# Patient Record
Sex: Male | Born: 1990 | Race: Asian | Hispanic: No | Marital: Married | Smoking: Never smoker
Health system: Southern US, Community
[De-identification: ages and names within clinical notes are randomized; demographics above are authoritative.]

## PROBLEM LIST (undated history)

## (undated) HISTORY — PX: NO PAST SURGERIES: SHX2092

---

## 2017-11-12 ENCOUNTER — Ambulatory Visit
Admission: RE | Admit: 2017-11-12 | Discharge: 2017-11-12 | Disposition: A | Discharge status: Home / Self Care | Payer: No Typology Code available for payment source | Source: Ambulatory Visit | Attending: Internal Medicine | Admitting: Internal Medicine

## 2017-11-12 ENCOUNTER — Other Ambulatory Visit: Payer: Self-pay | Admitting: Internal Medicine

## 2017-11-12 DIAGNOSIS — A150 Tuberculosis of lung: Secondary | ICD-10-CM

## 2018-04-14 NOTE — Progress Notes (Addendum)
[facility] [address], [place], [place] [address] [phone]  Date:  04/15/2018   Name:  Paul Mccann   DOB:  Mar 25, 1991   MRN:  [id number]  PCP:  Darreld Mclean, MD    Chief Complaint: New Patient (Initial Visit) (chest tightness, comes and goes, no pain)   History of Present Illness:  Paul Mccann is a 27 y.o. very pleasant male patient who presents with the following:  New patient here today to establish care. 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  As far as he knows his TB concern is considered to be resolved  No kids yet but they do have dogs He has generally been in good health- no operations or serious illness in the past that he can recall   He would like to check labs today  He is not aware of any health problems in his family  He does mention chest tightness once or twice a month- for about a year now It may last seconds.  He does not feel like he has chest pain- just a tightness It is not exertional- may occur anytime He has not had this sx in the last several weeks   He will do some floor exercises and running.  He does not chest tightness with running No wheezing He is a non smoker and does not drink alcohol In his free time he likes to play games and watch movies  There are no active problems to display for this patient.   No past medical history on file.    Social History   Tobacco Use  . Smoking status: Never Smoker  . Smokeless tobacco: Never Used  Substance Use Topics  . Alcohol use: Not Currently  . Drug use: Never    Family History  Family  history unknown: Yes    Not on File  Medication list has been reviewed and updated.  No current outpatient medications on file prior to visit.   No current facility-administered medications on file prior to visit.     Review of Systems:  As per HPI- otherwise negative.   Physical Examination: Vitals:   04/15/18 0915  BP: 112/70  Pulse: 78  Resp: 16  SpO2: 98%   Vitals:   04/15/18 0915  Weight: 176 lb (79.8 kg)  Height: 5' 8" (1.727 m)   Body mass index is 26.76 kg/m. Ideal Body Weight: Weight in (lb) to have BMI = 25: 164.1  GEN: WDWN, NAD, Non-toxic, A & O x 3, looks well  HEENT: Atraumatic, Normocephalic. Neck supple. No masses, No LAD.  Bilateral TM wnl, oropharynx normal.  PEERL,EOMI.   Ears and Nose: No external deformity. CV: RRR, No M/G/R. No JVD. No thrill. No extra heart sounds. PULM: CTA B, no wheezes, crackles, rhonchi. No retractions. No resp. distress. No accessory muscle use. ABD: S, NT, ND. No rebound. No HSM. EXTR: No c/c/e NEURO Normal gait.  PSYCH: Normally interactive. Conversant. Not depressed or anxious appearing.  Calm demeanor.   EKG: NSR, no ST elevation  or depression noted  He does have early repol pattern Assessment and Plan: Physical exam  Screening for deficiency anemia - Plan: CBC  Screening for hyperlipidemia - Plan: Lipid panel  Screening for diabetes mellitus - Plan: Comprehensive metabolic panel, Hemoglobin A1c  Chest tightness - Plan: EKG 12-Lead  Exposure to measles virus - Plan: Measles/Mumps/Rubella Immunity  CPE today, new patient He brought an old immunization record from when [place] the [place] - Tdap is utd, he only had one MMR however.  We will do an MMR titer for him along with other labs today Will plan further follow- up pending labs. Discussed chest tightness- his description and lack of risk factors make me feel less concerned. He will monitor and let me know if this is changing or getting worse at all    Signed Jessica Copland, MD   Received his labs 8/23  Results for orders placed or performed in visit on 04/15/18  CBC  Result Value Ref Range   WBC 7.1 4.0 - 10.5 K/uL   RBC 5.09 4.22 - 5.81 Mil/uL   Platelets 310.0 150.0 - 400.0 K/uL   Hemoglobin 14.9 13.0 - 17.0 g/dL   HCT 44.5 39.0 - 52.0 %   MCV 87.4 78.0 - 100.0 fl   MCHC 33.5 30.0 - 36.0 g/dL   RDW 12.9 11.5 - 15.5 %  Comprehensive metabolic panel  Result Value Ref Range   Sodium 137 135 - 145 mEq/L   Potassium 4.0 3.5 - 5.1 mEq/L   Chloride 102 96 - 112 mEq/L   CO2 27 19 - 32 mEq/L   Glucose, Bld 99 70 - 99 mg/dL   BUN 16 6 - 23 mg/dL   Creatinine, Ser 0.89 0.40 - 1.50 mg/dL   Total Bilirubin 0.7 0.2 - 1.2 mg/dL   Alkaline Phosphatase 75 39 - 117 U/L   AST 20 0 - 37 U/L   ALT 36 0 - 53 U/L   Total Protein 7.3 6.0 - 8.3 g/dL   Albumin 4.7 3.5 - 5.2 g/dL   Calcium 10.2 8.4 - 10.5 mg/dL   GFR 108.85 >60.00 mL/min  Hemoglobin A1c  Result Value Ref Range   Hgb A1c MFr Bld 5.8 4.6 - 6.5 %  Lipid panel  Result Value Ref Range   Cholesterol 219 (H) 0 - 200 mg/dL   Triglycerides 182.0 (H) 0.0 - 149.0 mg/dL   HDL 42.00 >39.00 mg/dL   VLDL 36.4 0.0 - 40.0 mg/dL   LDL Cholesterol 140 (H) 0 - 99 mg/dL   Total CHOL/HDL Ratio 5    NonHDL 176.62   Measles/Mumps/Rubella Immunity  Result Value Ref Range   Rubeola IgG >300.00 AU/mL   Mumps IgG 75.70 AU/mL   Rubella 10.70 index     

## 2018-04-15 ENCOUNTER — Encounter: Payer: Self-pay | Admitting: Family Medicine

## 2018-04-15 ENCOUNTER — Ambulatory Visit: Payer: 59 | Admitting: Family Medicine

## 2018-04-15 VITALS — BP 112/70 | HR 78 | Resp 16 | Ht 68.0 in | Wt 176.0 lb

## 2018-04-15 DIAGNOSIS — Z130 Encounter for screening for diseases of the blood and blood-forming organs and certain disorders involving the immune mechanism: Secondary | ICD-10-CM

## 2018-04-15 DIAGNOSIS — R0789 Other chest pain: Secondary | ICD-10-CM

## 2018-04-15 DIAGNOSIS — Z13220 Encounter for screening for lipoid disorders: Secondary | ICD-10-CM | POA: Diagnosis not present

## 2018-04-15 DIAGNOSIS — Z20828 Contact with and (suspected) exposure to other viral communicable diseases: Secondary | ICD-10-CM

## 2018-04-15 DIAGNOSIS — Z0000 Encounter for general adult medical examination without abnormal findings: Secondary | ICD-10-CM | POA: Diagnosis not present

## 2018-04-15 DIAGNOSIS — Z131 Encounter for screening for diabetes mellitus: Secondary | ICD-10-CM | POA: Diagnosis not present

## 2018-04-15 LAB — LIPID PANEL
Cholesterol: 219 mg/dL — ABNORMAL HIGH (ref 0–200)
HDL: 42 mg/dL (ref >39.00)
LDL Cholesterol: 140 mg/dL — ABNORMAL HIGH (ref 0–99)
NONHDL: 176.62
Total CHOL/HDL Ratio: 5
Triglycerides: 182 mg/dL — ABNORMAL HIGH (ref 0.0–149.0)
VLDL: 36.4 mg/dL (ref 0.0–40.0)

## 2018-04-15 LAB — COMPREHENSIVE METABOLIC PANEL
ALBUMIN: 4.7 g/dL (ref 3.5–5.2)
ALT: 36 U/L (ref 0–53)
AST: 20 U/L (ref 0–37)
Alkaline Phosphatase: 75 U/L (ref 39–117)
BILIRUBIN TOTAL: 0.7 mg/dL (ref 0.2–1.2)
BUN: 16 mg/dL (ref 6–23)
CALCIUM: 10.2 mg/dL (ref 8.4–10.5)
CHLORIDE: 102 meq/L (ref 96–112)
CO2: 27 mEq/L (ref 19–32)
CREATININE: 0.89 mg/dL (ref 0.40–1.50)
GFR: 108.85 mL/min (ref >60.00)
Glucose, Bld: 99 mg/dL (ref 70–99)
Potassium: 4 mEq/L (ref 3.5–5.1)
SODIUM: 137 meq/L (ref 135–145)
Total Protein: 7.3 g/dL (ref 6.0–8.3)

## 2018-04-15 LAB — HEMOGLOBIN A1C: HEMOGLOBIN A1C: 5.8 % (ref 4.6–6.5)

## 2018-04-15 LAB — CBC
HCT: 44.5 % (ref 39.0–52.0)
Hemoglobin: 14.9 g/dL (ref 13.0–17.0)
MCHC: 33.5 g/dL (ref 30.0–36.0)
MCV: 87.4 fl (ref 78.0–100.0)
PLATELETS: 310 10*3/uL (ref 150.0–400.0)
RBC: 5.09 Mil/uL (ref 4.22–5.81)
RDW: 12.9 % (ref 11.5–15.5)
WBC: 7.1 10*3/uL (ref 4.0–10.5)

## 2018-04-15 NOTE — Patient Instructions (Addendum)
It was nice to meet you today-  I will be in touch with your labs asap Please let me know if your chest tightness changes or gets worse Do continue to exercise on a regular basis and maintain your weight You will need a tetanus shot next year  I only see evidence of one MMR vaccine- we will screen for immunity for you today with your labs as you are at risk of exposure    Health Maintenance, Male A healthy lifestyle and preventive care is important for your health and wellness. Ask your health care provider about what schedule of regular examinations is right for you. What should I know about weight and diet? Eat a Healthy Diet  Eat plenty of vegetables, fruits, whole grains, low-fat dairy products, and lean protein.  Do not eat a lot of foods high in solid fats, added sugars, or salt.  Maintain a Healthy Weight Regular exercise can help you achieve or maintain a healthy weight. You should:  Do at least 150 minutes of exercise each week. The exercise should increase your heart rate and make you sweat (moderate-intensity exercise).  Do strength-training exercises at least twice a week.  Watch Your Levels of Cholesterol and Blood Lipids  Have your blood tested for lipids and cholesterol every 5 years starting at 27 years of age. If you are at high risk for heart disease, you should start having your blood tested when you are 27 years old. You may need to have your cholesterol levels checked more often if: ? Your lipid or cholesterol levels are high. ? You are older than 27 years of age. ? You are at high risk for heart disease.  What should I know about cancer screening? Many types of cancers can be detected early and may often be prevented. Lung Cancer  You should be screened every year for lung cancer if: ? You are a current smoker who has smoked for at least 30 years. ? You are a former smoker who has quit within the past 15 years.  Talk to your health care provider about your  screening options, when you should start screening, and how often you should be screened.  Colorectal Cancer  Routine colorectal cancer screening usually begins at 27 years of age and should be repeated every 5-10 years until you are 27 years old. You may need to be screened more often if early forms of precancerous polyps or small growths are found. Your health care provider may recommend screening at an earlier age if you have risk factors for colon cancer.  Your health care provider may recommend using home test kits to check for hidden blood in the stool.  A small camera at the end of a tube can be used to examine your colon (sigmoidoscopy or colonoscopy). This checks for the earliest forms of colorectal cancer.  Prostate and Testicular Cancer  Depending on your age and overall health, your health care provider may do certain tests to screen for prostate and testicular cancer.  Talk to your health care provider about any symptoms or concerns you have about testicular or prostate cancer.  Skin Cancer  Check your skin from head to toe regularly.  Tell your health care provider about any new moles or changes in moles, especially if: ? There is a change in a mole's size, shape, or color. ? You have a mole that is larger than a pencil eraser.  Always use sunscreen. Apply sunscreen liberally and repeat throughout the day.  Protect yourself by wearing long sleeves, pants, a wide-brimmed hat, and sunglasses when outside.  What should I know about heart disease, diabetes, and high blood pressure?  If you are 42-37 years of age, have your blood pressure checked every 3-5 years. If you are 54 years of age or older, have your blood pressure checked every year. You should have your blood pressure measured twice-once when you are at a hospital or clinic, and once when you are not at a hospital or clinic. Record the average of the two measurements. To check your blood pressure when you are not at  a hospital or clinic, you can use: ? An automated blood pressure machine at a pharmacy. ? A home blood pressure monitor.  Talk to your health care provider about your target blood pressure.  If you are between 32-32 years old, ask your health care provider if you should take aspirin to prevent heart disease.  Have regular diabetes screenings by checking your fasting blood sugar level. ? If you are at a normal weight and have a low risk for diabetes, have this test once every three years after the age of 45. ? If you are overweight and have a high risk for diabetes, consider being tested at a younger age or more often.  A one-time screening for abdominal aortic aneurysm (AAA) by ultrasound is recommended for men aged 64-75 years who are current or former smokers. What should I know about preventing infection? Hepatitis B If you have a higher risk for hepatitis B, you should be screened for this virus. Talk with your health care provider to find out if you are at risk for hepatitis B infection. Hepatitis C Blood testing is recommended for:  Everyone born from [birth year] through 1965.  Anyone with known risk factors for hepatitis C.  Sexually Transmitted Diseases (STDs)  You should be screened each year for STDs including gonorrhea and chlamydia if: ? You are sexually active and are younger than 27 years of age. ? You are older than 27 years of age and your health care provider tells you that you are at risk for this type of infection. ? Your sexual activity has changed since you were last screened and you are at an increased risk for chlamydia or gonorrhea. Ask your health care provider if you are at risk.  Talk with your health care provider about whether you are at high risk of being infected with HIV. Your health care provider may recommend a prescription medicine to help prevent HIV infection.  What else can I do?  Schedule regular health, dental, and eye exams.  Stay current with  your vaccines (immunizations).  Do not use any tobacco products, such as cigarettes, chewing tobacco, and e-cigarettes. If you need help quitting, ask your health care provider.  Limit alcohol intake to no more than 2 drinks per day. One drink equals 12 ounces of beer, 5 ounces of wine, or 1 ounces of hard liquor.  Do not use street drugs.  Do not share needles.  Ask your health care provider for help if you need support or information about quitting drugs.  Tell your health care provider if you often feel depressed.  Tell your health care provider if you have ever been abused or do not feel safe at home. This information is not intended to replace advice given to you by your health care provider. Make sure you discuss any questions you have with your health care provider. Document Released: 02/08/2008 Document  Revised: 04/10/2016 Document Reviewed: 05/16/2015 Elsevier Interactive Patient Education  [employer].

## 2018-04-16 LAB — MEASLES/MUMPS/RUBELLA IMMUNITY
Mumps IgG: 75.7 AU/mL
Rubella: 10.7 index
Rubeola IgG: >300 AU/mL

## 2018-11-10 NOTE — Progress Notes (Deleted)
[facility] [address] [place], [place] [address] [phone]  Date:  11/11/2018   Name:  BRISCO BOLEYN   DOB:  07/07/1991   MRN:  [id number]  PCP:  Pearline Cables, MD    Chief Complaint: No chief complaint on file.   History of Present Illness:  MATTHEUS LUGG is a 28 y.o. 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  As far as he knows his TB concern is considered to be resolved  No kids yet but they do have dogs  There are no active problems to display for this patient.   No past medical history on file.  No past surgical history on file.  Social History   Tobacco Use  . Smoking status: Never Smoker  . Smokeless tobacco: Never Used  Substance Use Topics  . Alcohol use: Not Currently  . Drug use: Never    Family History  Family history unknown: Yes    Not on File  Medication list has been reviewed and updated.  No current outpatient medications on file prior to visit.   No current facility-administered medications on file prior to visit.     Review of Systems:  As per HPI- otherwise negative.   Physical Examination: There were no vitals filed for this visit. There were no vitals filed for this visit. There is no height or weight on file to calculate BMI. Ideal Body Weight:    GEN: WDWN, NAD, Non-toxic, A & O x 3 HEENT: Atraumatic, Normocephalic. Neck supple. No masses, No LAD. Ears and Nose: No external deformity. CV: RRR, No M/G/R. No JVD. No thrill. No extra  heart sounds. PULM: CTA B, no wheezes, crackles, rhonchi. No retractions. No resp. distress. No accessory muscle use. ABD: S, NT, ND, +BS. No rebound. No HSM. EXTR: No c/c/e NEURO Normal gait.  PSYCH: Normally interactive. Conversant. Not depressed or anxious appearing.  Calm demeanor.    Assessment and Plan: ***  Signed Abbe Amsterdam, MD

## 2018-11-11 ENCOUNTER — Other Ambulatory Visit: Payer: Self-pay

## 2018-11-11 ENCOUNTER — Ambulatory Visit: Payer: 59 | Admitting: Family Medicine

## 2018-11-11 ENCOUNTER — Encounter: Payer: Self-pay | Admitting: Family Medicine

## 2018-11-11 VITALS — BP 108/84 | HR 84 | Temp 99.8°F | Resp 18 | Ht 68.0 in | Wt 187.9 lb

## 2018-11-11 DIAGNOSIS — R6889 Other general symptoms and signs: Secondary | ICD-10-CM

## 2018-11-11 LAB — POC INFLUENZA A&B (BINAX/QUICKVUE)
Influenza A, POC: NEGATIVE
Influenza B, POC: NEGATIVE

## 2018-11-11 NOTE — Progress Notes (Signed)
[facility] [address] [place], [place] [address] [phone]  Date:  11/11/2018   Name:  Paul Mccann   DOB:  [birth year]   MRN:  [id number]  PCP:  Pearline Cables, MD    Chief Complaint: Cough (Pt states having stuffy nose on Monday and cough started last night. Pt reports chills, no body aches/SOB and didn't check temp. )   History of Present Illness:  Paul Mccann is a 28 y.o. 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   No past medical history on file.  No past surgical history on file.  Social History   Tobacco Use  . Smoking status: Never Smoker  . Smokeless tobacco: Never Used  Substance Use Topics  . Alcohol use: Not Currently  . Drug use: Never    Family History  Family history unknown: Yes    No Known Allergies  Medication list has been reviewed and updated.  No current outpatient medications on file prior to visit.   No current facility-administered medications on file prior to visit.     Review of Systems:  As per HPI- otherwise negative.   Physical Examination: Vitals:   11/11/18 1614 11/11/18 1649  BP: 108/84   Pulse: (!) 104 84  Resp: 18   Temp: 99.8 F (37.7 C)   SpO2: 97%    Vitals:   11/11/18 1614  Weight: 187 lb 14.4 oz (85.2 kg)  Height: 5\' 8"   (1.727 m)   Body mass index is 28.57 kg/m. Ideal Body Weight: Weight in (lb) to have BMI = 25: 164.1  GEN: WDWN, NAD, Non-toxic, A & O x 3, looks well except for nasal congestion HEENT: Atraumatic, Normocephalic. Neck supple. No masses, No LAD.  Bilateral TM wnl, oropharynx normal.  PEERL,EOMI.   Ears and Nose: No external deformity. CV: RRR, No M/G/R. No JVD. No thrill. No extra heart sounds. PULM: CTA B, no wheezes, crackles, rhonchi. No retractions. No resp. distress. No accessory muscle use. ABD: S, NT, ND EXTR: No c/c/e NEURO Normal gait.  PSYCH: Normally interactive. Conversant. Not depressed or anxious appearing.  Calm demeanor.   Results for orders placed or performed in visit on 11/11/18  POC Influenza A&B (Binax test)  Result Value Ref Range   Influenza A, POC Negative Negative   Influenza B, POC Negative Negative     Assessment and Plan: Flu-like symptoms - Plan: POC Influenza A&B (Binax test)  Pt here today with mild flu like sx Rapid flu negative  He does not currently meet guidelines for COVID 19 testing as no travel and no known positive contact.  He also has no proven significant fever.  However I have advised him that as  testing become more widely available I am glad to order test for him if he likes Have asked him to self- quarantine and gave note for his job for him to work from home   He is to alert me or otherwise seek care if getting worse or any other concerns Signed Abbe Amsterdam, MD

## 2018-11-11 NOTE — Patient Instructions (Signed)
You appear to have a viral infection- at this time you are not high risk for COVID 19, and we do not have the capacity to test non high- risk persons.  However if you are getting worse over the next few days please do alert me Also I am hoping that testing will extend to more people in the coming days; as this occurs I am glad to order testing for you  For the time being please self quarantine at home as much as you are able  Coronavirus (COVID-19) Are you at risk?  Are you at risk for the Coronavirus (COVID-19)?  To be considered HIGH RISK for Coronavirus (COVID-19), you have to meet the following criteria:  . Traveled to [place], [place], [place], [place] or [place]; or in the [place] to [place], [place], [place], or [place]; and have fever, cough, and shortness of breath within the last 2 weeks of travel OR . Been in close contact with a person diagnosed with COVID-19 within the last 2 weeks and have fever, cough, and shortness of breath . IF YOU DO NOT MEET THESE CRITERIA, YOU ARE CONSIDERED LOW RISK FOR COVID-19.  What to do if you are HIGH RISK for COVID-19?  Marland Kitchen If you are having a medical emergency, call 911. . Seek medical care right away. Before you go to a doctor's office, urgent care or emergency department, call ahead and tell them about your recent travel, contact with someone diagnosed with COVID-19, and your symptoms. You should receive instructions from your physician's office regarding next steps of care.  . When you arrive at healthcare provider, tell the healthcare staff immediately you have returned from visiting [place], [place], [place], [place] or [place]; or traveled in the [place] to [place], [place], [place], or [place]; in the last two weeks or you have been in close contact with a person diagnosed with COVID-19 in the last 2 weeks.   . Tell the health care staff about your symptoms: fever, cough and shortness of breath. . After you have been  seen by a medical provider, you will be either: o Tested for (COVID-19) and discharged home on quarantine except to seek medical care if symptoms worsen, and asked to  - Stay home and avoid contact with others until you get your results (4-5 days)  - Avoid travel on public transportation if possible (such as bus, train, or airplane) or o Sent to the Emergency Department by EMS for evaluation, COVID-19 testing, and possible admission depending on your condition and test results.  What to do if you are LOW RISK for COVID-19?  Reduce your risk of any infection by using the same precautions used for avoiding the common cold or flu:  Marland Kitchen Wash your hands often with soap and warm water for at least 20 seconds.  If soap and water are not readily available, use an alcohol-based hand sanitizer with at least 60% alcohol.  . If coughing or sneezing, cover your mouth and nose by coughing or sneezing into the elbow areas of your shirt or coat, into a tissue or into your sleeve (not your hands). . Avoid shaking hands with others and consider head nods or verbal greetings only. . Avoid touching your eyes, nose, or mouth with unwashed hands.  . Avoid close contact with people who are sick. . Avoid places or events with large numbers of people in one location, like concerts or sporting events. . Carefully consider travel plans you  have or are making. . If you are planning any travel outside or inside the [place], visit the CDC's Travelers' Health webpage for the latest health notices. . If you have some symptoms but not all symptoms, continue to monitor at home and seek medical attention if your symptoms worsen. . If you are having a medical emergency, call 911.   ADDITIONAL HEALTHCARE OPTIONS FOR PATIENTS  [facility] / e-Visit: [email or web address]         [facility]: [phone]  [facility]: [phone]                   [facility]  [facility]: [phone]

## 2018-11-12 ENCOUNTER — Encounter: Payer: Self-pay | Admitting: Family Medicine

## 2018-11-12 ENCOUNTER — Telehealth: Payer: 59 | Admitting: Family

## 2018-11-12 ENCOUNTER — Other Ambulatory Visit: Payer: Self-pay | Admitting: Family Medicine

## 2018-11-12 DIAGNOSIS — J069 Acute upper respiratory infection, unspecified: Secondary | ICD-10-CM

## 2018-11-12 MED ORDER — BENZONATATE 100 MG PO CAPS: 100.0000 mg | ORAL_CAPSULE | Freq: Three times a day (TID) | ORAL | 0 refills | Status: DC | PRN

## 2018-11-12 MED ORDER — ALBUTEROL SULFATE HFA 108 (90 BASE) MCG/ACT IN AERS: 2.0000 | INHALATION_SPRAY | Freq: Four times a day (QID) | RESPIRATORY_TRACT | 0 refills | Status: DC | PRN

## 2018-11-12 MED ORDER — BENZONATATE 200 MG PO CAPS
200.0000 mg | ORAL_CAPSULE | Freq: Three times a day (TID) | ORAL | 0 refills | Status: DC | PRN
Start: 2018-11-12 — End: 2018-11-12

## 2018-11-12 NOTE — Progress Notes (Signed)
Greater than 5 minutes, yet less than 10 minutes of time have been spent researching, coordinating, and implementing care for this patient today.  Thank you for the details you included in the comment boxes. Those details are very helpful in determining the best course of treatment for you and help [place] to provide the best care.  The good news is that your fever is improving. That, and not having severe shortness of breath steer [place] away from testing for the virus. You are low-risk.  E-Visit for Corona Virus Screening Based on your current symptoms, it seems unlikely that your symptoms are related to the [place] virus.   Coronavirus disease 2019 (COVID-19) is a respiratory illness that can spread from person to person. The virus that causes COVID-19 is a new virus that was first identified in the country of [place] but is now found in multiple other countries and has spread to the [place].  Symptoms associated with the virus are mild to severe fever, cough, and shortness of breath. There is currently no vaccine to protect against COVID-19, and there is no specific antiviral treatment for the virus.  It is vitally important that if you feel that you have an infection such as this virus or any other virus that you stay home and away from places where you may spread it to others.  Currently, not all patients are being tested. If the symptoms are mild and there is not a known exposure, performing the test is not indicated.  You can use medication such as A prescription cough medication called Tessalon Perles 100 mg. You may take 1-2 capsules every 8 hours as needed for cough and A prescription inhaler called Albuterol MDI 90 mcg /actuation 2 puffs every 4 hours as needed for shortness of breath, wheezing, cough  Reduce your risk of any infection by using the same precautions used for avoiding the common cold or flu:  Marland Kitchen Wash your hands often with soap and warm water for at least 20 seconds.  If soap and  water are not readily available, use an alcohol-based hand sanitizer with at least 60% alcohol.  . If coughing or sneezing, cover your mouth and nose by coughing or sneezing into the elbow areas of your shirt or coat, into a tissue or into your sleeve (not your hands). . Avoid shaking hands with others and consider head nods or verbal greetings only. . Avoid touching your eyes, nose, or mouth with unwashed hands.  . Avoid close contact with people who are sick. . Avoid places or events with large numbers of people in one location, like concerts or sporting events. . Carefully consider travel plans you have or are making. . If you are planning any travel outside or inside the [place], visit the CDC's Travelers' Health webpage for the latest health notices. . If you have some symptoms but not all symptoms, continue to monitor at home and seek medical attention if your symptoms worsen. . If you are having a medical emergency, call 911.  HOME CARE . Only take medications as instructed by your medical team. . Drink plenty of fluids and get plenty of rest. . A steam or ultrasonic humidifier can help if you have congestion.   GET HELP RIGHT AWAY IF: . You develop worsening fever. . You become short of breath . You cough up blood. . Your symptoms become more severe MAKE SURE YOU   Understand these instructions.  Will watch your condition.  Will get help right away  if you are not doing well or get worse.  Your e-visit answers were reviewed by a board certified advanced clinical practitioner to complete your personal care plan.  Depending on the condition, your plan could have included both over the counter or prescription medications.  If there is a problem please reply once you have received a response from your provider. Your safety is important to [place].  If you have drug allergies check your prescription carefully.    You can use MyChart to ask questions about today's visit, request a non-urgent  call back, or ask for a work or school excuse for 24 hours related to this e-Visit. If it has been greater than 24 hours you will need to follow up with your provider, or enter a new e-Visit to address those concerns. You will get an e-mail in the next two days asking about your experience.  I hope that your e-visit has been valuable and will speed your recovery. Thank you for using e-visits.

## 2018-11-12 NOTE — Addendum Note (Signed)
Addended by: Abbe Amsterdam C on: 11/12/2018 07:04 PM   Modules accepted: Orders

## 2018-11-22 ENCOUNTER — Encounter: Payer: Self-pay | Admitting: Family Medicine

## 2018-11-22 DIAGNOSIS — R05 Cough: Secondary | ICD-10-CM

## 2018-11-22 DIAGNOSIS — R059 Cough, unspecified: Secondary | ICD-10-CM

## 2018-11-23 ENCOUNTER — Encounter: Payer: Self-pay | Admitting: Family Medicine

## 2018-11-23 DIAGNOSIS — R059 Cough, unspecified: Secondary | ICD-10-CM

## 2018-11-23 DIAGNOSIS — R05 Cough: Secondary | ICD-10-CM

## 2018-11-23 MED ORDER — AZITHROMYCIN 250 MG PO TABS: ORAL_TABLET | ORAL | 0 refills | Status: DC

## 2018-11-23 MED ORDER — HYDROCOD POLST-CPM POLST ER 10-8 MG/5ML PO SUER: 2.5000 mL | Freq: Two times a day (BID) | ORAL | 0 refills | Status: DC | PRN

## 2018-11-23 NOTE — Telephone Encounter (Signed)
Called pt - he notes just a cough, he is otherwise feeling ok The cough is worse at night  This makes it hard to sleep No fever He is not SOB  Overall he is feeling better, just coughing for over 2 weeks now Generally in good health   Will treat with zpack for bronchitis, tussionex for cough (hycodan on back order)

## 2018-12-01 MED ORDER — HYDROCOD POLST-CPM POLST ER 10-8 MG/5ML PO SUER: 2.5000 mL | Freq: Two times a day (BID) | ORAL | 0 refills | Status: DC | PRN

## 2018-12-01 NOTE — Addendum Note (Signed)
Addended by: Abbe Amsterdam C on: 12/01/2018 07:56 AM   Modules accepted: Orders

## 2019-08-30 ENCOUNTER — Ambulatory Visit: Payer: 59 | Attending: Internal Medicine

## 2019-08-30 DIAGNOSIS — Z20822 Contact with and (suspected) exposure to covid-19: Secondary | ICD-10-CM

## 2019-08-31 LAB — NOVEL CORONAVIRUS, NAA: SARS-CoV-2, NAA: NOT DETECTED

## 2019-11-06 ENCOUNTER — Ambulatory Visit: Payer: 59

## 2019-11-19 ENCOUNTER — Ambulatory Visit: Payer: 59

## 2019-11-27 ENCOUNTER — Ambulatory Visit: Payer: 59 | Attending: Internal Medicine

## 2019-11-27 DIAGNOSIS — Z23 Encounter for immunization: Secondary | ICD-10-CM

## 2019-11-27 NOTE — Progress Notes (Signed)
   Covid-19 Vaccination Clinic  Name:  Paul Mccann    MRN: [id number] DOB: 1990-11-06  11/27/2019  Paul Mccann was observed post Covid-19 immunization for 15 minutes without incident. He was provided with Vaccine Information Sheet and instruction to access the V-Safe system.   Paul Mccann was instructed to call 911 with any severe reactions post vaccine: Marland Kitchen Difficulty breathing  . Swelling of face and throat  . A fast heartbeat  . A bad rash all over body  . Dizziness and weakness   Immunizations Administered    Name Date Dose VIS Date Route   Pfizer COVID-19 Vaccine 11/27/2019 10:36 AM 0.3 mL 08/06/2019 Intramuscular   Manufacturer: [employer], [employer]   Lot: [id number]   NDC: [address]-4

## 2019-12-02 ENCOUNTER — Ambulatory Visit: Payer: 59 | Attending: Internal Medicine

## 2019-12-02 ENCOUNTER — Other Ambulatory Visit: Payer: Self-pay

## 2019-12-02 DIAGNOSIS — Z20822 Contact with and (suspected) exposure to covid-19: Secondary | ICD-10-CM

## 2019-12-03 LAB — SARS-COV-2, NAA 2 DAY TAT

## 2019-12-03 LAB — NOVEL CORONAVIRUS, NAA: SARS-CoV-2, NAA: NOT DETECTED

## 2019-12-22 ENCOUNTER — Ambulatory Visit: Payer: 59 | Attending: Internal Medicine

## 2019-12-22 DIAGNOSIS — Z23 Encounter for immunization: Secondary | ICD-10-CM

## 2019-12-22 NOTE — Progress Notes (Signed)
   Covid-19 Vaccination Clinic  Name:  Paul Mccann    MRN: [id number] DOB: 1991-05-16  12/22/2019  Mr. Hedberg was observed post Covid-19 immunization for 15 minutes without incident. He was provided with Vaccine Information Sheet and instruction to access the V-Safe system.   Mr. Roddey was instructed to call 911 with any severe reactions post vaccine: Marland Kitchen Difficulty breathing  . Swelling of face and throat  . A fast heartbeat  . A bad rash all over body  . Dizziness and weakness   Immunizations Administered    Name Date Dose VIS Date Route   Pfizer COVID-19 Vaccine 12/22/2019 10:50 AM 0.3 mL 10/20/2018 Intramuscular   Manufacturer: [employer], [employer]   Lot: [id number]   NDC: [address]-4

## 2019-12-22 NOTE — Progress Notes (Signed)
   Covid-19 Vaccination Clinic  Name:  Paul Mccann    MRN: [id number] DOB: 11/06/1990  12/22/2019  Mr. [name] was observed post Covid-19 immunization for 15 minutes without incident. He was provided with Vaccine Information Sheet and instruction to access the V-Safe system.   Mr. [name] was instructed to call 911 with any severe reactions post vaccine: . Difficulty breathing  . Swelling of face and throat  . A fast heartbeat  . A bad rash all over body  . Dizziness and weakness   Immunizations Administered    Name Date Dose VIS Date Route   Pfizer COVID-19 Vaccine 12/22/2019 10:50 AM 0.3 mL 10/20/2018 Intramuscular   Manufacturer: Pfizer, Inc   Lot: [id number]   NDC: [address]-2     

## 2020-05-08 NOTE — Progress Notes (Deleted)
[facility] [address] [place], [place] [address] [phone]  Date:  05/11/2020   Name:  Paul Mccann   DOB:  [birth year]   MRN:  [id number]  PCP:  Pearline Cables, MD    Chief Complaint: No chief complaint on file.   History of Present Illness:  Paul Mccann is a 29 y.o. very pleasant male patient who presents with the following:  Generally healthy young man here today for physical exam Last seen by myself March 2020 for a sick visit  [place] he has lived in [place] for about 1 decade. He is married  Routine labs are due Tetanus vaccine Flu shot COVID-19 complete  There are no problems to display for this patient.   No past medical history on file.  No past surgical history on file.  Social History   Tobacco Use  . Smoking status: Never Smoker  . Smokeless tobacco: Never Used  Vaping Use  . Vaping Use: Never used  Substance Use Topics  . Alcohol use: Not Currently  . Drug use: Never    Family History  Family history unknown: Yes    No Known Allergies  Medication list has been reviewed and updated.  Current Outpatient Medications on File Prior to Visit  Medication Sig Dispense Refill  . albuterol (PROVENTIL HFA;VENTOLIN HFA) 108 (90 Base) MCG/ACT inhaler Inhale 2 puffs into the lungs every 6 (six) hours as needed for shortness of breath. 1 Inhaler 0  . azithromycin (ZITHROMAX) 250 MG tablet Use as a zpack 6 tablet 0  . benzonatate (TESSALON) 100 MG capsule Take 1 capsule (100 mg total) by mouth 3 (three) times daily as needed for cough. 40 capsule 0  . chlorpheniramine-HYDROcodone (TUSSIONEX PENNKINETIC ER) 10-8 MG/5ML SUER Take 2.5-5 mLs by mouth every 12 (twelve) hours as needed for cough. 50 mL 0   No current facility-administered medications on file prior to visit.    Review of Systems:  As per HPI- otherwise negative.   Physical  Examination: There were no vitals filed for this visit. There were no vitals filed for this visit. There is no height or weight on file to calculate BMI. Ideal Body Weight:    GEN: no acute distress. HEENT: Atraumatic, Normocephalic.  Ears and Nose: No external deformity. CV: RRR, No M/G/R. No JVD. No thrill. No extra heart sounds. PULM: CTA B, no wheezes, crackles, rhonchi. No retractions. No resp. distress. No accessory muscle use. ABD: S, NT, ND, +BS. No rebound. No HSM. EXTR: No c/c/e PSYCH: Normally interactive. Conversant.    Assessment and Plan: *** This visit occurred during the SARS-CoV-2 public health emergency.  Safety protocols were in place, including screening questions prior to the visit, additional usage of staff PPE, and extensive cleaning of exam room while observing appropriate contact time as indicated for disinfecting solutions.    Signed Abbe Amsterdam, MD

## 2020-05-09 ENCOUNTER — Encounter: Payer: Self-pay | Admitting: Family Medicine

## 2020-05-11 ENCOUNTER — Ambulatory Visit (INDEPENDENT_AMBULATORY_CARE_PROVIDER_SITE_OTHER): Payer: 59 | Admitting: Family Medicine

## 2020-05-11 DIAGNOSIS — Z114 Encounter for screening for human immunodeficiency virus [HIV]: Secondary | ICD-10-CM

## 2020-05-11 DIAGNOSIS — Z131 Encounter for screening for diabetes mellitus: Secondary | ICD-10-CM | POA: Diagnosis not present

## 2020-05-11 DIAGNOSIS — Z130 Encounter for screening for diseases of the blood and blood-forming organs and certain disorders involving the immune mechanism: Secondary | ICD-10-CM

## 2020-05-11 DIAGNOSIS — Z13220 Encounter for screening for lipoid disorders: Secondary | ICD-10-CM | POA: Diagnosis not present

## 2020-05-11 DIAGNOSIS — Z0000 Encounter for general adult medical examination without abnormal findings: Secondary | ICD-10-CM

## 2020-05-11 DIAGNOSIS — Z1159 Encounter for screening for other viral diseases: Secondary | ICD-10-CM

## 2020-05-29 ENCOUNTER — Encounter: Payer: 59 | Admitting: Family Medicine

## 2020-05-29 NOTE — Progress Notes (Deleted)
[facility] [address], [address] [place], [place] [address] [phone]  Date:  06/07/2020   Name:  Paul Mccann   DOB:  06-19-1991   MRN:  [id number]  PCP:  Pearline Cables, MD    Chief Complaint: No chief complaint on file.   History of Present Illness:  Paul Mccann is a 29 y.o. very pleasant male patient who presents with the following:  Generally healthy young man here today for physical Last seen by myself for sick visit in March 2020 He works in [employer] Married  Flu vaccine Tetanus vaccine COVID-19 series completed Most recent labs 2019  There are no problems to display for this patient.   No past medical history on file.  No past surgical history on file.  Social History   Tobacco Use  . Smoking status: Never Smoker  . Smokeless tobacco: Never Used  Vaping Use  . Vaping Use: Never used  Substance Use Topics  . Alcohol use: Not Currently  . Drug use: Never    Family History  Family history unknown: Yes    No Known Allergies  Medication list has been reviewed and updated.  Current Outpatient Medications on File Prior to Visit  Medication Sig Dispense Refill  . albuterol (PROVENTIL HFA;VENTOLIN HFA) 108 (90 Base) MCG/ACT inhaler Inhale 2 puffs into the lungs every 6 (six) hours as needed for shortness of breath. 1 Inhaler 0  . azithromycin (ZITHROMAX) 250 MG tablet Use as a zpack 6 tablet 0  . benzonatate (TESSALON) 100 MG capsule Take 1 capsule (100 mg total) by mouth 3 (three) times daily as needed for cough. 40 capsule 0  . chlorpheniramine-HYDROcodone (TUSSIONEX PENNKINETIC ER) 10-8 MG/5ML SUER Take 2.5-5 mLs by mouth every 12 (twelve) hours as needed for cough. 50 mL 0   No current facility-administered medications on file prior to visit.    Review of Systems:  As per HPI- otherwise negative.   Physical Examination: There were no vitals filed for this  visit. There were no vitals filed for this visit. There is no height or weight on file to calculate BMI. Ideal Body Weight:    GEN: no acute distress. HEENT: Atraumatic, Normocephalic.  Ears and Nose: No external deformity. CV: RRR, No M/G/R. No JVD. No thrill. No extra heart sounds. PULM: CTA B, no wheezes, crackles, rhonchi. No retractions. No resp. distress. No accessory muscle use. ABD: S, NT, ND, +BS. No rebound. No HSM. EXTR: No c/c/e PSYCH: Normally interactive. Conversant.    Assessment and Plan: *** This visit occurred during the SARS-CoV-2 public health emergency.  Safety protocols were in place, including screening questions prior to the visit, additional usage of staff PPE, and extensive cleaning of exam room while observing appropriate contact time as indicated for disinfecting solutions.    Signed Abbe Amsterdam, MD

## 2020-06-07 ENCOUNTER — Ambulatory Visit (INDEPENDENT_AMBULATORY_CARE_PROVIDER_SITE_OTHER): Payer: 59 | Admitting: Family Medicine

## 2020-06-07 DIAGNOSIS — Z5329 Procedure and treatment not carried out because of patient's decision for other reasons: Secondary | ICD-10-CM | POA: Diagnosis not present

## 2020-11-03 ENCOUNTER — Encounter: Payer: Self-pay | Admitting: Family Medicine

## 2020-11-12 NOTE — Progress Notes (Addendum)
[facility] [address] [place], [place] [address] [phone]  Date:  11/16/2020   Name:  Paul Mccann   DOB:  [birth year]   MRN:  [id number]  PCP:  Pearline Cables, MD    Chief Complaint: Cough (10/16/2020 covid post)   History of Present Illness:  Paul Mccann is a 30 y.o. very pleasant male patient who presents with the following:  Pt here today to discuss a cough He contacted me 3/11 with the following mychart: Hope you are doing well. Me and my wife had covid 3 weeks ago, we got through it but my cough stays up until now and Robitusin isn't helping me at all. Kind of like the same as last time when I consulted about my cough 2 or 3 years ago. When I get a cough it last for too long.  Cough is dry and hurting my chest every time I cough.   Since we had not seen him in a couple of years I asked him to come in for eval The cough has mostly resolved at this point His energy level is coming back to normal   Flu vaccine- done in the fall  covid booster- he had his booster in February of this year  Tetanus - booster today   His wife is expecting a baby boy in August!   Wt Readings from Last 3 Encounters:  11/16/20 205 lb (93 kg)  11/11/18 187 lb 14.4 oz (85.2 kg)  04/15/18 176 lb (79.8 kg)    There are no problems to display for this patient.   History reviewed. No pertinent past medical history.  History reviewed. No pertinent surgical history.  Social History   Tobacco Use  . Smoking status: Never Smoker  . Smokeless tobacco: Never Used  Vaping Use  . Vaping Use: Never used  Substance Use Topics  . Alcohol use: Not Currently  . Drug use: Never    Family History  Family history unknown: Yes    No Known Allergies  Medication list has been reviewed and updated.  No current outpatient medications on file prior to visit.   No current facility-administered medications on file prior to  visit.    Review of Systems:  As per HPI- otherwise negative.   Physical Examination: Vitals:   11/16/20 1534  BP: 122/80  Pulse: 100  Resp: 17  Temp: 98.4 F (36.9 C)  SpO2: 95%   Vitals:   11/16/20 1534  Weight: 205 lb (93 kg)  Height: 5\' 8"  (1.727 m)   Body mass index is 31.17 kg/m. Ideal Body Weight: Weight in (lb) to have BMI = 25: 164.1  GEN: no acute distress.  Overweight, looks well  HEENT: Atraumatic, Normocephalic.  Ears and Nose: No external deformity. CV: RRR, No M/G/R. No JVD. No thrill. No extra heart sounds. PULM: CTA B, no wheezes, crackles, rhonchi. No retractions. No resp. distress. No accessory muscle use. ABD: S, NT, ND, +BS. No rebound. No HSM. EXTR: No c/c/e PSYCH: Normally interactive. Conversant.    Assessment and Plan: Physical exam  Weight gain - Plan: TSH  Screening for deficiency anemia - Plan: CBC  Screening for diabetes mellitus - Plan: Comprehensive metabolic panel, Hemoglobin A1c  Screening for hyperlipidemia - Plan: Lipid panel  Immunization due - Plan: Td vaccine greater than or equal to 7yo preservative free IM  Encounter for hepatitis C screening test for low risk patient -  Plan: CANCELED: Hepatitis C antibody  CPE today- encouraged pt to work on diet and exercise. Moderate weight loss Updated tetanus Will plan further follow- up pending labs. He has recovered from his covid 19 illness   This visit occurred during the SARS-CoV-2 public health emergency.  Safety protocols were in place, including screening questions prior to the visit, additional usage of staff PPE, and extensive cleaning of exam room while observing appropriate contact time as indicated for disinfecting solutions.     Signed Abbe Amsterdam, MD  Received labs as below 3/25- message to pt Results for orders placed or performed in visit on 11/16/20  CBC  Result Value Ref Range   WBC 8.6 4.0 - 10.5 K/uL   RBC 4.82 4.22 - 5.81 Mil/uL   Platelets  320.0 150.0 - 400.0 K/uL   Hemoglobin 14.4 13.0 - 17.0 g/dL   HCT 40.8 14.4 - 81.8 %   MCV 86.5 78.0 - 100.0 fl   MCHC 34.5 30.0 - 36.0 g/dL   RDW 56.3 14.9 - 70.2 %  Comprehensive metabolic panel  Result Value Ref Range   Sodium 139 135 - 145 mEq/L   Potassium 3.9 3.5 - 5.1 mEq/L   Chloride 100 96 - 112 mEq/L   CO2 28 19 - 32 mEq/L   Glucose, Bld 118 (H) 70 - 99 mg/dL   BUN 14 6 - 23 mg/dL   Creatinine, Ser 6.37 0.40 - 1.50 mg/dL   Total Bilirubin 0.6 0.2 - 1.2 mg/dL   Alkaline Phosphatase 69 39 - 117 U/L   AST 32 0 - 37 U/L   ALT 70 (H) 0 - 53 U/L   Total Protein 7.7 6.0 - 8.3 g/dL   Albumin 5.2 3.5 - 5.2 g/dL   GFR 85.88 >50.27 mL/min   Calcium 10.3 8.4 - 10.5 mg/dL  Hemoglobin X4J  Result Value Ref Range   Hgb A1c MFr Bld 6.0 4.6 - 6.5 %  Lipid panel  Result Value Ref Range   Cholesterol 277 (H) 0 - 200 mg/dL   Triglycerides 287.8 (H) 0.0 - 149.0 mg/dL   HDL 67.67 (L) >20.94 mg/dL   VLDL 70.9 (H) 0.0 - 62.8 mg/dL   Total CHOL/HDL Ratio 7    NonHDL 239.60   TSH  Result Value Ref Range   TSH 1.26 0.35 - 4.50 uIU/mL  LDL cholesterol, direct  Result Value Ref Range   Direct LDL 186.0 mg/dL

## 2020-11-12 NOTE — Patient Instructions (Addendum)
Good to see you again today! [facility] your [facility]!   We boosted your tetanus today I will be in touch with your labs asap Do work on losing the weight you have gained and try to establish a regular exercise program   Health Maintenance, Male Adopting a healthy lifestyle and getting preventive care are important in promoting health and wellness. Ask your health care provider about:  The right schedule for you to have regular tests and exams.  Things you can do on your own to prevent diseases and keep yourself healthy. What should I know about diet, weight, and exercise? Eat a healthy diet  Eat a diet that includes plenty of vegetables, fruits, low-fat dairy products, and lean protein.  Do not eat a lot of foods that are high in solid fats, added sugars, or sodium.   Maintain a healthy weight Body mass index (BMI) is a measurement that can be used to identify possible weight problems. It estimates body fat based on height and weight. Your health care provider can help determine your BMI and help you achieve or maintain a healthy weight. Get regular exercise Get regular exercise. This is one of the most important things you can do for your health. Most adults should:  Exercise for at least 150 minutes each week. The exercise should increase your heart rate and make you sweat (moderate-intensity exercise).  Do strengthening exercises at least twice a week. This is in addition to the moderate-intensity exercise.  Spend less time sitting. Even light physical activity can be beneficial. Watch cholesterol and blood lipids Have your blood tested for lipids and cholesterol at 30 years of age, then have this test every 5 years. You may need to have your cholesterol levels checked more often if:  Your lipid or cholesterol levels are high.  You are older than 30 years of age.  You are at high risk for heart disease. What should I know about cancer screening? Many types of  cancers can be detected early and may often be prevented. Depending on your health history and family history, you may need to have cancer screening at various ages. This may include screening for:  Colorectal cancer.  Prostate cancer.  Skin cancer.  Lung cancer. What should I know about heart disease, diabetes, and high blood pressure? Blood pressure and heart disease  High blood pressure causes heart disease and increases the risk of stroke. This is more likely to develop in people who have high blood pressure readings, are of African descent, or are overweight.  Talk with your health care provider about your target blood pressure readings.  Have your blood pressure checked: ? Every 3-5 years if you are 69-50 years of age. ? Every year if you are 29 years old or older.  If you are between the ages of 93 and 55 and are a current or former smoker, ask your health care provider if you should have a one-time screening for abdominal aortic aneurysm (AAA). Diabetes Have regular diabetes screenings. This checks your fasting blood sugar level. Have the screening done:  Once every three years after age 10 if you are at a normal weight and have a low risk for diabetes.  More often and at a younger age if you are overweight or have a high risk for diabetes. What should I know about preventing infection? Hepatitis B If you have a higher risk for hepatitis B, you should be screened for this virus. Talk with your health care  provider to find out if you are at risk for hepatitis B infection. Hepatitis C Blood testing is recommended for:  Everyone born from [birth year] through 1965.  Anyone with known risk factors for hepatitis C. Sexually transmitted infections (STIs)  You should be screened each year for STIs, including gonorrhea and chlamydia, if: ? You are sexually active and are younger than 30 years of age. ? You are older than 30 years of age and your health care provider tells you that you  are at risk for this type of infection. ? Your sexual activity has changed since you were last screened, and you are at increased risk for chlamydia or gonorrhea. Ask your health care provider if you are at risk.  Ask your health care provider about whether you are at high risk for HIV. Your health care provider may recommend a prescription medicine to help prevent HIV infection. If you choose to take medicine to prevent HIV, you should first get tested for HIV. You should then be tested every 3 months for as long as you are taking the medicine. Follow these instructions at home: Lifestyle  Do not use any products that contain nicotine or tobacco, such as cigarettes, e-cigarettes, and chewing tobacco. If you need help quitting, ask your health care provider.  Do not use street drugs.  Do not share needles.  Ask your health care provider for help if you need support or information about quitting drugs. Alcohol use  Do not drink alcohol if your health care provider tells you not to drink.  If you drink alcohol: ? Limit how much you have to 0-2 drinks a day. ? Be aware of how much alcohol is in your drink. In the [place] one drink equals one 12 oz bottle of beer (355 mL), one 5 oz glass of wine (148 mL), or one 1 oz glass of hard liquor (44 mL). General instructions  Schedule regular health, dental, and eye exams.  Stay current with your vaccines.  Tell your health care provider if: ? You often feel depressed. ? You have ever been abused or do not feel safe at home. Summary  Adopting a healthy lifestyle and getting preventive care are important in promoting health and wellness.  Follow your health care provider's instructions about healthy diet, exercising, and getting tested or screened for diseases.  Follow your health care provider's instructions on monitoring your cholesterol and blood pressure. This information is not intended to replace advice given to you by your health care  provider. Make sure you discuss any questions you have with your health care provider. Document Revised: 08/05/2018 Document Reviewed: 08/05/2018 Elsevier Patient Education  2021 [employer].

## 2020-11-15 ENCOUNTER — Ambulatory Visit: Payer: Self-pay | Admitting: Family Medicine

## 2020-11-16 ENCOUNTER — Encounter: Payer: Self-pay | Admitting: Family Medicine

## 2020-11-16 ENCOUNTER — Other Ambulatory Visit: Payer: Self-pay

## 2020-11-16 ENCOUNTER — Ambulatory Visit: Payer: 59 | Admitting: Family Medicine

## 2020-11-16 VITALS — BP 122/80 | HR 100 | Temp 98.4°F | Resp 17 | Ht 68.0 in | Wt 205.0 lb

## 2020-11-16 DIAGNOSIS — Z13220 Encounter for screening for lipoid disorders: Secondary | ICD-10-CM | POA: Diagnosis not present

## 2020-11-16 DIAGNOSIS — Z1159 Encounter for screening for other viral diseases: Secondary | ICD-10-CM

## 2020-11-16 DIAGNOSIS — Z130 Encounter for screening for diseases of the blood and blood-forming organs and certain disorders involving the immune mechanism: Secondary | ICD-10-CM | POA: Diagnosis not present

## 2020-11-16 DIAGNOSIS — Z0000 Encounter for general adult medical examination without abnormal findings: Secondary | ICD-10-CM

## 2020-11-16 DIAGNOSIS — Z23 Encounter for immunization: Secondary | ICD-10-CM | POA: Diagnosis not present

## 2020-11-16 DIAGNOSIS — R635 Abnormal weight gain: Secondary | ICD-10-CM

## 2020-11-16 DIAGNOSIS — Z131 Encounter for screening for diabetes mellitus: Secondary | ICD-10-CM

## 2020-11-17 ENCOUNTER — Encounter: Payer: Self-pay | Admitting: Family Medicine

## 2020-11-17 LAB — COMPREHENSIVE METABOLIC PANEL
ALT: 70 U/L — ABNORMAL HIGH (ref 0–53)
AST: 32 U/L (ref 0–37)
Albumin: 5.2 g/dL (ref 3.5–5.2)
Alkaline Phosphatase: 69 U/L (ref 39–117)
BUN: 14 mg/dL (ref 6–23)
CO2: 28 mEq/L (ref 19–32)
Calcium: 10.3 mg/dL (ref 8.4–10.5)
Chloride: 100 mEq/L (ref 96–112)
Creatinine, Ser: 1.05 mg/dL (ref 0.40–1.50)
GFR: 95.76 mL/min (ref >60.00)
Glucose, Bld: 118 mg/dL — ABNORMAL HIGH (ref 70–99)
Potassium: 3.9 mEq/L (ref 3.5–5.1)
Sodium: 139 mEq/L (ref 135–145)
Total Bilirubin: 0.6 mg/dL (ref 0.2–1.2)
Total Protein: 7.7 g/dL (ref 6.0–8.3)

## 2020-11-17 LAB — CBC
HCT: 41.7 % (ref 39.0–52.0)
Hemoglobin: 14.4 g/dL (ref 13.0–17.0)
MCHC: 34.5 g/dL (ref 30.0–36.0)
MCV: 86.5 fl (ref 78.0–100.0)
Platelets: 320 10*3/uL (ref 150.0–400.0)
RBC: 4.82 Mil/uL (ref 4.22–5.81)
RDW: 12.8 % (ref 11.5–15.5)
WBC: 8.6 10*3/uL (ref 4.0–10.5)

## 2020-11-17 LAB — LIPID PANEL
Cholesterol: 277 mg/dL — ABNORMAL HIGH (ref 0–200)
HDL: 37.4 mg/dL — ABNORMAL LOW (ref >39.00)
NonHDL: 239.6
Total CHOL/HDL Ratio: 7
Triglycerides: 368 mg/dL — ABNORMAL HIGH (ref 0.0–149.0)
VLDL: 73.6 mg/dL — ABNORMAL HIGH (ref 0.0–40.0)

## 2020-11-17 LAB — HEMOGLOBIN A1C: Hgb A1c MFr Bld: 6 % (ref 4.6–6.5)

## 2020-11-17 LAB — TSH: TSH: 1.26 u[IU]/mL (ref 0.35–4.50)

## 2020-11-17 LAB — LDL CHOLESTEROL, DIRECT: Direct LDL: 186 mg/dL

## 2021-02-19 ENCOUNTER — Encounter: Payer: Self-pay | Admitting: Family Medicine

## 2021-02-23 ENCOUNTER — Encounter: Payer: Self-pay | Admitting: Family Medicine

## 2021-02-28 NOTE — Progress Notes (Deleted)
[facility] at [facility] [address], [address] [place], [place] [address] [phone]  Date:  03/07/2021   Name:  Paul Mccann   DOB:  [birth year]   MRN:  [id number]  PCP:  Pearline Cables, MD    Chief Complaint: No chief complaint on file.   History of Present Illness:  Paul Mccann is a 30 y.o. very pleasant male patient who presents with the following:  Pt seen today to discuss "recnet problems"- he sent [place] the following message:  Hey Dr Dallas Schimke hope you are doing well. Had a problem that started just this morning when I woke up, Felt a burning/sting sensation on the tip of my private part when I pee/while I pee.  This is the first time I've experienced this. Had to stop and slowly pee to ease the pain.  Generally in good health except for overweight, last visit with myself was in March  His wife is due in August   There are no problems to display for this patient.   No past medical history on file.  No past surgical history on file.  Social History   Tobacco Use   Smoking status: Never   Smokeless tobacco: Never  Vaping Use   Vaping Use: Never used  Substance Use Topics   Alcohol use: Not Currently   Drug use: Never    Family History  Family history unknown: Yes    No Known Allergies  Medication list has been reviewed and updated.  No current outpatient medications on file prior to visit.   No current facility-administered medications on file prior to visit.    Review of Systems:  ***  Physical Examination: There were no vitals filed for this visit. There were no vitals filed for this visit. There is no height or weight on file to calculate BMI. Ideal Body Weight:    ***  Assessment and Plan: ***  Signed Abbe Amsterdam, MD

## 2021-03-06 NOTE — Progress Notes (Signed)
[facility] at [facility] [address], [address] [place], [place] [address] [phone]  Date:  03/09/2021   Name:  Paul Mccann   DOB:  04/14/1991   MRN:  [id number]  PCP:  Pearline Cables, MD    Chief Complaint: Sore Throat (Started on Monday ) and Cough (It hurts chest and back because it is violent )   History of Present Illness:  Paul Mccann is a 30 y.o. very pleasant male patient who presents with the following:  Pt seen today for concern of illness Last seen by myself for a CPE in March- he recently had covid 19 at that time  His wife is expecting a baby boy next month -she is also sick right now His most recent labs showed dyslipidemia and also mild LFT elevation, pre diabetes   I had asked him to return in a few months to check his A1c, lipids and liver function   He first got sick this past Monday- today is Friday He noted sx of a painful ST, cough- it hurts to cough  He did a rapid test for covid today symptoms began and it was negative No fever noted No chills or aches No vomiting or diarrhea  He notes that on 7/1 he had an episode of painful urination- he felt like he could not pass his urine  He then felt better and his sx seemed to resolve No blood in his urine No history of kidney stones He is in a monagamous relationship and does not suspect any STI risk  Wt Readings from Last 3 Encounters:  03/09/21 201 lb (91.2 kg)  11/16/20 205 lb (93 kg)  11/11/18 187 lb 14.4 oz (85.2 kg)    There are no problems to display for this patient.   No past medical history on file.  No past surgical history on file.  Social History   Tobacco Use   Smoking status: Never   Smokeless tobacco: Never  Vaping Use   Vaping Use: Never used  Substance Use Topics   Alcohol use: Not Currently   Drug use: Never    Family History  Family history unknown: Yes    No Known Allergies  Medication list has been reviewed  and updated.  Current Outpatient Medications on File Prior to Visit  Medication Sig Dispense Refill   guaiFENesin (MUCINEX) 600 MG 12 hr tablet Take by mouth 2 (two) times daily.     No current facility-administered medications on file prior to visit.    Review of Systems:  As per HPI- otherwise negative.   Physical Examination: Vitals:   03/09/21 0828  BP: 122/78  Pulse: 95  Temp: 98.2 F (36.8 C)  SpO2: 98%   Vitals:   03/09/21 0828  Weight: 201 lb (91.2 kg)  Height: 5\' 8"  (1.727 m)   Body mass index is 30.56 kg/m. Ideal Body Weight: Weight in (lb) to have BMI = 25: 164.1  GEN: no acute distress.  Appears mildly ill HEENT: Atraumatic, Normocephalic.  Bilateral TM wnl, oropharynx is erythematous but no exudate, PEERL,EOMI.   Ears and Nose: No external deformity. CV: RRR, No M/G/R. No JVD. No thrill. No extra heart sounds. PULM: CTA B, no wheezes, crackles, rhonchi. No retractions. No resp. distress. No accessory muscle use. ABD: S, NT, ND, +BS. No rebound. No HSM. EXTR: No c/c/e PSYCH: Normally interactive. Conversant.  No penile discharge or lesions  Results for orders placed  or performed in visit on 03/09/21  POCT rapid strep A  Result Value Ref Range   Rapid Strep A Screen Positive (A) Negative  POCT urinalysis dipstick  Result Value Ref Range   Color, UA yellow yellow   Clarity, UA clear clear   Glucose, UA negative negative mg/dL   Bilirubin, UA negative negative   Ketones, POC UA negative negative mg/dL   Spec Grav, UA 3.846 6.599 - 1.025   Blood, UA negative negative   pH, UA 6.0 5.0 - 8.0   Protein Ur, POC trace (A) negative mg/dL   Urobilinogen, UA 0.2 0.2 or 1.0 E.U./dL   Nitrite, UA Negative Negative   Leukocytes, UA Negative Negative    Assessment and Plan: Strep throat - Plan: penicillin v potassium (VEETID) 500 MG tablet, HYDROcodone bit-homatropine (HYCODAN) 5-1.5 MG/5ML syrup, Novel Coronavirus, NAA ([facility]), CANCELED: Novel  Coronavirus, NAA ([facility])  Pharyngitis, unspecified etiology - Plan: POCT rapid strep A, HYDROcodone bit-homatropine (HYCODAN) 5-1.5 MG/5ML syrup  Dysuria - Plan: Urine Culture, POCT urinalysis dipstick, Cytology (oral, anal, urethral) ancillary only, C. trachomatis/N. gonorrhoeae RNA  Patient seen today with concern of illness.  Consider COVID-19, but his rapid strep is also positive. We will treat him with penicillin for 10 days Hycodan syrup to use as needed for painful sore throat and cough-caution regarding sedation I did send out a COVID-19 PCR  He had an episode of painful urination about 2 weeks ago, now resolved.  Low risk for STI, will check gonorrhea and chlamydia swab, also urine culture  Will plan further follow- up pending labs.  This visit occurred during the SARS-CoV-2 public health emergency.  Safety protocols were in place, including screening questions prior to the visit, additional usage of staff PPE, and extensive cleaning of exam room while observing appropriate contact time as indicated for disinfecting solutions.   Signed Abbe Amsterdam, MD

## 2021-03-07 ENCOUNTER — Ambulatory Visit: Payer: 59 | Admitting: Family Medicine

## 2021-03-09 ENCOUNTER — Other Ambulatory Visit: Payer: Self-pay

## 2021-03-09 ENCOUNTER — Ambulatory Visit: Payer: 59 | Admitting: Family Medicine

## 2021-03-09 ENCOUNTER — Encounter: Payer: Self-pay | Admitting: Family Medicine

## 2021-03-09 VITALS — BP 122/78 | HR 95 | Temp 98.2°F | Ht 68.0 in | Wt 201.0 lb

## 2021-03-09 DIAGNOSIS — R300 Dysuria: Secondary | ICD-10-CM

## 2021-03-09 DIAGNOSIS — J029 Acute pharyngitis, unspecified: Secondary | ICD-10-CM | POA: Diagnosis not present

## 2021-03-09 DIAGNOSIS — J020 Streptococcal pharyngitis: Secondary | ICD-10-CM

## 2021-03-09 LAB — POCT URINALYSIS DIP (MANUAL ENTRY)
Bilirubin, UA: NEGATIVE
Blood, UA: NEGATIVE
Glucose, UA: NEGATIVE mg/dL
Ketones, POC UA: NEGATIVE mg/dL
Leukocytes, UA: NEGATIVE
Nitrite, UA: NEGATIVE
Spec Grav, UA: 1.015 (ref 1.010–1.025)
Urobilinogen, UA: 0.2 E.U./dL
pH, UA: 6 (ref 5.0–8.0)

## 2021-03-09 LAB — POCT RAPID STREP A (OFFICE): Rapid Strep A Screen: POSITIVE — AB

## 2021-03-09 MED ORDER — HYDROCODONE BIT-HOMATROP MBR 5-1.5 MG/5ML PO SOLN: 5.0000 mL | Freq: Three times a day (TID) | ORAL | 0 refills | Status: AC | PRN

## 2021-03-09 MED ORDER — PENICILLIN V POTASSIUM 500 MG PO TABS: 500.0000 mg | ORAL_TABLET | Freq: Two times a day (BID) | ORAL | 0 refills | Status: DC

## 2021-03-09 NOTE — Patient Instructions (Signed)
It was good to see you today but I am sorry you are ill!  You tested positive for strep throat We will treat this with penicillin- you should feel much improved in 1-2 days Please let me know if you are not getting better- sooner if worse You can also use the cough syrup as needed for cough and for pain- it has a pain medication in it, don't drive after taking Stay home from work today  I will be in touch with your urine results

## 2021-03-10 ENCOUNTER — Encounter: Payer: Self-pay | Admitting: Family Medicine

## 2021-03-10 LAB — NOVEL CORONAVIRUS, NAA: SARS-CoV-2, NAA: NOT DETECTED

## 2021-03-10 LAB — SARS-COV-2, NAA 2 DAY TAT

## 2021-03-11 LAB — URINE CULTURE
MICRO NUMBER:: 12124712
Result:: NO GROWTH
SPECIMEN QUALITY:: ADEQUATE

## 2021-03-12 LAB — C. TRACHOMATIS/N. GONORRHOEAE RNA
C. trachomatis RNA, TMA: NOT DETECTED
N. gonorrhoeae RNA, TMA: NOT DETECTED

## 2021-08-29 ENCOUNTER — Ambulatory Visit: Payer: 59 | Admitting: Family Medicine

## 2021-09-09 NOTE — Patient Instructions (Addendum)
Good to see you today!  I will be in touch with your labs asap Flu shot given today  Try a topical anti-fungal cream such as clotrimazole or miconazole twice a day on your rash Assuming your liver is back to normal I can then rx an oral anti-fungal if you need it I would suggest getting the newest covid booster if not done already

## 2021-09-09 NOTE — Progress Notes (Addendum)
[facility] at [facility] [address] [place], [place] [address] [phone]  Date:  09/12/2021   Name:  Paul Mccann   DOB:  [birth year]   MRN:  [id number]  PCP:  Pearline Cables, MD    Chief Complaint: Follow-up (Concerns/ questions: pt has a rash around the right underarm/)   History of Present Illness:  Paul Mccann is a 31 y.o. very pleasant male patient who presents with the following:  Patient seen today for follow-up Most recent visit with myself was in July when he had strep throat Most recent physical was in March  He is married His wife gave birth last August; this is their first child, a son who is now 65 months old.  He is doing well!   Can do hepatitis C screening with lab work COVID-19 booster-recommended Flu shot- give today   Labs done in March.  At that time we noted significant dyslipidemia, elevated ALT, A1c 6%  He has noted an itchy rash in his underarms for 2-3 weeks- started on the right and spread There is also some rash in the groin  He has tried an anti-itch cream which sounds like a topical steroid.  He is otherwise feeling well.  He does not know what could have triggered this rash There are no problems to display for this patient.   No past medical history on file.  No past surgical history on file.  Social History   Tobacco Use   Smoking status: Never   Smokeless tobacco: Never  Vaping Use   Vaping Use: Never used  Substance Use Topics   Alcohol use: Not Currently   Drug use: Never    Family History  Family history unknown: Yes    No Known Allergies  Medication list has been reviewed and updated.  No current outpatient medications on file prior to visit.   No current facility-administered medications on file prior to visit.    Review of Systems:  As per HPI- otherwise negative.   Physical Examination: Vitals:   09/12/21 1257  BP: 112/72  Pulse: 88  Resp: 18   Temp: 98.1 F (36.7 C)  SpO2: 98%   Vitals:   09/12/21 1257  Weight: 202 lb (91.6 kg)  Height: 5\' 8"  (1.727 m)   Body mass index is 30.71 kg/m. Ideal Body Weight: Weight in (lb) to have BMI = 25: 164.1  GEN: no acute distress.  Mild obesity, looks well HEENT: Atraumatic, Normocephalic.  Ears and Nose: No external deformity. CV: RRR, No M/G/R. No JVD. No thrill. No extra heart sounds. PULM: CTA B, no wheezes, crackles, rhonchi. No retractions. No resp. distress. No accessory muscle use. EXTR: No c/c/e PSYCH: Normally interactive. Conversant.  Patient has a mildly erythematous and scaly rash present in both underarms.  He also states a similar rash is present in the groin folds  Assessment and Plan: Hyperlipidemia, unspecified hyperlipidemia type - Plan: Lipid panel  Transaminitis - Plan: Comprehensive metabolic panel, Hepatitis, Acute  Prediabetes - Plan: Hemoglobin A1c  Tinea corporis  Patient seen today for follow-up.  He was noted to have transaminitis last year, at that time he had been taking a lot of Tylenol to deal with the COVID-19 infection.  We will recheck his liver function and also a hepatitis panel today  Follow-up on lipids and prediabetes  Recommend over-the-counter antifungals for tinea corporis.  Assuming his LFTs are normal we can also  use a round of oral terbinafine if needed  Signed Abbe Amsterdam, MD  Received labs as below, message to patient  Results for orders placed or performed in visit on 09/12/21  Comprehensive metabolic panel  Result Value Ref Range   Sodium 138 135 - 145 mEq/L   Potassium 3.8 3.5 - 5.1 mEq/L   Chloride 100 96 - 112 mEq/L   CO2 30 19 - 32 mEq/L   Glucose, Bld 126 (H) 70 - 99 mg/dL   BUN 15 6 - 23 mg/dL   Creatinine, Ser 1.95 0.40 - 1.50 mg/dL   Total Bilirubin 0.9 0.2 - 1.2 mg/dL   Alkaline Phosphatase 59 39 - 117 U/L   AST 32 0 - 37 U/L   ALT 63 (H) 0 - 53 U/L   Total Protein 7.5 6.0 - 8.3 g/dL   Albumin 4.9  3.5 - 5.2 g/dL   GFR 09.32 >67.12 mL/min   Calcium 9.8 8.4 - 10.5 mg/dL  Lipid panel  Result Value Ref Range   Cholesterol 245 (H) 0 - 200 mg/dL   Triglycerides 458.0 (H) 0.0 - 149.0 mg/dL   HDL 99.83 >38.25 mg/dL   VLDL 05.3 (H) 0.0 - 97.6 mg/dL   Total CHOL/HDL Ratio 6    NonHDL 203.60   Hemoglobin A1c  Result Value Ref Range   Hgb A1c MFr Bld 6.1 4.6 - 6.5 %  LDL cholesterol, direct  Result Value Ref Range   Direct LDL 174.0 mg/dL

## 2021-09-12 ENCOUNTER — Encounter: Payer: Self-pay | Admitting: Family Medicine

## 2021-09-12 ENCOUNTER — Ambulatory Visit: Payer: 59 | Admitting: Family Medicine

## 2021-09-12 VITALS — BP 112/72 | HR 88 | Temp 98.1°F | Resp 18 | Ht 68.0 in | Wt 202.0 lb

## 2021-09-12 DIAGNOSIS — R7303 Prediabetes: Secondary | ICD-10-CM

## 2021-09-12 DIAGNOSIS — R7401 Elevation of levels of liver transaminase levels: Secondary | ICD-10-CM | POA: Diagnosis not present

## 2021-09-12 DIAGNOSIS — Z23 Encounter for immunization: Secondary | ICD-10-CM | POA: Diagnosis not present

## 2021-09-12 DIAGNOSIS — B354 Tinea corporis: Secondary | ICD-10-CM | POA: Diagnosis not present

## 2021-09-12 DIAGNOSIS — E663 Overweight: Secondary | ICD-10-CM

## 2021-09-12 DIAGNOSIS — E785 Hyperlipidemia, unspecified: Secondary | ICD-10-CM | POA: Diagnosis not present

## 2021-09-12 HISTORY — DX: Prediabetes: R73.03

## 2021-09-12 HISTORY — DX: Overweight: E66.3

## 2021-09-12 LAB — COMPREHENSIVE METABOLIC PANEL
ALT: 63 U/L — ABNORMAL HIGH (ref 0–53)
AST: 32 U/L (ref 0–37)
Albumin: 4.9 g/dL (ref 3.5–5.2)
Alkaline Phosphatase: 59 U/L (ref 39–117)
BUN: 15 mg/dL (ref 6–23)
CO2: 30 mEq/L (ref 19–32)
Calcium: 9.8 mg/dL (ref 8.4–10.5)
Chloride: 100 mEq/L (ref 96–112)
Creatinine, Ser: 1.02 mg/dL (ref 0.40–1.50)
GFR: 98.58 mL/min (ref >60.00)
Glucose, Bld: 126 mg/dL — ABNORMAL HIGH (ref 70–99)
Potassium: 3.8 mEq/L (ref 3.5–5.1)
Sodium: 138 mEq/L (ref 135–145)
Total Bilirubin: 0.9 mg/dL (ref 0.2–1.2)
Total Protein: 7.5 g/dL (ref 6.0–8.3)

## 2021-09-12 LAB — LDL CHOLESTEROL, DIRECT: Direct LDL: 174 mg/dL

## 2021-09-12 LAB — LIPID PANEL
Cholesterol: 245 mg/dL — ABNORMAL HIGH (ref 0–200)
HDL: 40.9 mg/dL (ref >39.00)
NonHDL: 203.6
Total CHOL/HDL Ratio: 6
Triglycerides: 267 mg/dL — ABNORMAL HIGH (ref 0.0–149.0)
VLDL: 53.4 mg/dL — ABNORMAL HIGH (ref 0.0–40.0)

## 2021-09-12 LAB — HEMOGLOBIN A1C: Hgb A1c MFr Bld: 6.1 % (ref 4.6–6.5)

## 2021-09-12 NOTE — Addendum Note (Signed)
Addended by: Mertha Finders on: 09/12/2021 01:48 PM   Modules accepted: Orders

## 2021-09-13 LAB — HEPATITIS PANEL, ACUTE
Hep A IgM: NONREACTIVE
Hep B C IgM: NONREACTIVE
Hepatitis B Surface Ag: NONREACTIVE
Hepatitis C Ab: NONREACTIVE
SIGNAL TO CUT-OFF: 0.14 (ref <1.00)

## 2021-09-24 ENCOUNTER — Telehealth (HOSPITAL_BASED_OUTPATIENT_CLINIC_OR_DEPARTMENT_OTHER): Payer: Self-pay

## 2021-09-28 ENCOUNTER — Ambulatory Visit (HOSPITAL_BASED_OUTPATIENT_CLINIC_OR_DEPARTMENT_OTHER)
Admission: RE | Admit: 2021-09-28 | Discharge: 2021-09-28 | Disposition: A | Discharge status: Home / Self Care | Payer: 59 | Source: Ambulatory Visit | Attending: Family Medicine | Admitting: Family Medicine

## 2021-09-28 ENCOUNTER — Other Ambulatory Visit: Payer: Self-pay

## 2021-09-28 DIAGNOSIS — R7401 Elevation of levels of liver transaminase levels: Secondary | ICD-10-CM | POA: Insufficient documentation

## 2021-10-01 ENCOUNTER — Encounter: Payer: Self-pay | Admitting: Family Medicine

## 2021-10-01 DIAGNOSIS — K760 Fatty (change of) liver, not elsewhere classified: Secondary | ICD-10-CM

## 2021-10-01 HISTORY — DX: Fatty (change of) liver, not elsewhere classified: K76.0

## 2021-11-13 ENCOUNTER — Ambulatory Visit: Payer: 59 | Admitting: Family

## 2021-11-13 VITALS — BP 129/86 | HR 87 | Temp 98.5°F | Resp 16 | Wt 201.8 lb

## 2021-11-13 DIAGNOSIS — J069 Acute upper respiratory infection, unspecified: Secondary | ICD-10-CM

## 2021-11-13 HISTORY — DX: Acute upper respiratory infection, unspecified: J06.9

## 2021-11-13 NOTE — Progress Notes (Signed)
? ?Subjective:  ? ?By signing my name below, I, Cassell Clement, attest that this documentation has been prepared under the direction and in the presence of Sandford Craze NP, 11/13/2021 ? ? Patient ID: Paul Mccann, male    DOB: 03-21-1991, 31 y.o.   MRN: [id number] ? ?Chief Complaint  ?Patient presents with  ? Nasal Congestion  ?  Patient complains of congestion   ? Fatigue  ?  Feeling fatigue  ? ? ?HPI ?Patient is in today for an office visit. ? ?Virus symptoms - Patient complains of a sore throat and sinus pain that begun on 11/11/2021. He believes that he developed his symptoms from his partner. His sore throat symptoms went away on 11/12/2021. However, his sinus pain is still persistent. He states that his body feels heavy/weak. He rarely coughs. He took a COVID - 19 test and it came back negative. He denies of any ear pain.  ? ?Health Maintenance Due  ?Topic Date Due  ? COVID-19 Vaccine (3 - Booster for Pfizer series) 02/16/2020  ? ? ?No past medical history on file. ? ?No past surgical history on file. ? ?Family History  ?Family history unknown: Yes  ? ? ?Social History  ? ?Socioeconomic History  ? Marital status: Married  ?  Spouse name: Not on file  ? Number of children: Not on file  ? Years of education: Not on file  ? Highest education level: Not on file  ?Occupational History  ? Not on file  ?Tobacco Use  ? Smoking status: Never  ? Smokeless tobacco: Never  ?Vaping Use  ? Vaping Use: Never used  ?Substance and Sexual Activity  ? Alcohol use: Not Currently  ? Drug use: Never  ? Sexual activity: Not on file  ?Other Topics Concern  ? Not on file  ?Social History Narrative  ? Not on file  ? ?Social Determinants of Health  ? ?Financial Resource Strain: Not on file  ?Food Insecurity: Not on file  ?Transportation Needs: Not on file  ?Physical Activity: Not on file  ?Stress: Not on file  ?Social Connections: Not on file  ?Intimate Partner Violence: Not on file  ? ? ?No outpatient medications prior to visit.   ? ?No facility-administered medications prior to visit.  ? ? ?No Known Allergies ? ?Review of Systems  ?HENT:  Positive for sinus pain. Negative for ear pain.   ?Respiratory:  Positive for cough (Rarely).   ?Neurological:  Positive for weakness.  ? ?   ?Objective:  ?  ?Physical Exam ?Constitutional:   ?   General: He is not in acute distress. ?   Appearance: Normal appearance. He is not ill-appearing.  ?HENT:  ?   Head: Normocephalic and atraumatic.  ?   Right Ear: External ear normal.  ?   Left Ear: External ear normal.  ?   Mouth/Throat:  ?   Pharynx: Posterior oropharyngeal erythema (Mild) present. No oropharyngeal exudate.  ?Eyes:  ?   Extraocular Movements: Extraocular movements intact.  ?   Pupils: Pupils are equal, round, and reactive to light.  ?Cardiovascular:  ?   Rate and Rhythm: Normal rate and regular rhythm.  ?   Heart sounds: Normal heart sounds. No murmur heard. ?  No gallop.  ?Pulmonary:  ?   Effort: Pulmonary effort is normal. No respiratory distress.  ?   Breath sounds: Normal breath sounds. No wheezing or rales.  ?Skin: ?   General: Skin is warm and dry.  ?Neurological:  ?  Mental Status: He is alert and oriented to person, place, and time.  ?Psychiatric:     ?   Mood and Affect: Mood normal.     ?   Behavior: Behavior normal.     ?   Judgment: Judgment normal.  ? ? ?BP 129/86 (BP Location: Right Arm, Patient Position: Sitting, Cuff Size: Large)   Pulse 87   Temp 98.5 ?F (36.9 ?C) (Oral)   Resp 16   Wt 201 lb 12.8 oz (91.5 kg)   SpO2 99%   BMI 30.68 kg/m?  ?Wt Readings from Last 3 Encounters:  ?11/13/21 201 lb 12.8 oz (91.5 kg)  ?09/12/21 202 lb (91.6 kg)  ?03/09/21 201 lb (91.2 kg)  ? ? ?   ?Assessment & Plan:  ? ?Problem List Items Addressed This Visit   ? ?  ? Unprioritized  ? Viral upper respiratory tract infection - Primary  ?  Discussed supportive measures such as tylenol/motrin for pain, sudafed/neti pot with distilled water for nasal congestion. Call if symptoms worsen or if not  improved in 3-4 days. ?  ?  ? ? ? ? ?No orders of the defined types were placed in this encounter. ? ? ?I, Lemont Fillers, NP, personally preformed the services described in this documentation.  All medical record entries made by the scribe were at my direction and in my presence.  I have reviewed the chart and discharge instructions (if applicable) and agree that the record reflects my personal performance and is accurate and complete. 11/13/2021 ? ? ?I,Amber Collins,acting as a Neurosurgeon for [employer], NP.,have documented all relevant documentation on the behalf of Lemont Fillers, NP,as directed by  Lemont Fillers, NP while in the presence of Lemont Fillers, NP. ? ? ? ?Lemont Fillers, NP ? ?

## 2021-11-13 NOTE — Assessment & Plan Note (Signed)
Discussed supportive measures such as tylenol/motrin for pain, sudafed/neti pot with distilled water for nasal congestion. Call if symptoms worsen or if not improved in 3-4 days. ?

## 2021-11-13 NOTE — Progress Notes (Incomplete)
? ?Subjective:  ? ?By signing my name below, I, Carylon Perches, attest that this documentation has been prepared under the direction and in the presence of Debbrah Alar NP, 11/13/2021   ? ? Patient ID: Paul Mccann, male    DOB: Dec 07, 1990, 31 y.o.   MRN: [id number] ? ?No chief complaint on file. ? ? ?HPI ?Patient is in today for an office visit.  ? ?Health Maintenance Due  ?Topic Date Due  ? COVID-19 Vaccine (3 - Booster for Pfizer series) 02/16/2020  ? ? ?No past medical history on file. ? ?No past surgical history on file. ? ?Family History  ?Family history unknown: Yes  ? ? ?Social History  ? ?Socioeconomic History  ? Marital status: Married  ?  Spouse name: Not on file  ? Number of children: Not on file  ? Years of education: Not on file  ? Highest education level: Not on file  ?Occupational History  ? Not on file  ?Tobacco Use  ? Smoking status: Never  ? Smokeless tobacco: Never  ?Vaping Use  ? Vaping Use: Never used  ?Substance and Sexual Activity  ? Alcohol use: Not Currently  ? Drug use: Never  ? Sexual activity: Not on file  ?Other Topics Concern  ? Not on file  ?Social History Narrative  ? Not on file  ? ?Social Determinants of Health  ? ?Financial Resource Strain: Not on file  ?Food Insecurity: Not on file  ?Transportation Needs: Not on file  ?Physical Activity: Not on file  ?Stress: Not on file  ?Social Connections: Not on file  ?Intimate Partner Violence: Not on file  ? ? ?No outpatient medications prior to visit.  ? ?No facility-administered medications prior to visit.  ? ? ?No Known Allergies ? ?ROS ? ?   ?Objective:  ?  ?Physical Exam ?Constitutional:   ?   General: He is not in acute distress. ?   Appearance: Normal appearance. He is not ill-appearing.  ?HENT:  ?   Head: Normocephalic and atraumatic.  ?   Right Ear: External ear normal.  ?   Left Ear: External ear normal.  ?Eyes:  ?   Extraocular Movements: Extraocular movements intact.  ?   Pupils: Pupils are equal, round, and reactive to  light.  ?Cardiovascular:  ?   Rate and Rhythm: Normal rate and regular rhythm.  ?   Heart sounds: Normal heart sounds. No murmur heard. ?Pulmonary:  ?   Effort: Pulmonary effort is normal. No respiratory distress.  ?   Breath sounds: Normal breath sounds. No wheezing or rales.  ?Skin: ?   General: Skin is warm and dry.  ?Neurological:  ?   Mental Status: He is alert and oriented to person, place, and time.  ?Psychiatric:     ?   Mood and Affect: Mood normal.     ?   Behavior: Behavior normal.     ?   Judgment: Judgment normal.  ? ? ?There were no vitals taken for this visit. ?Wt Readings from Last 3 Encounters:  ?09/12/21 202 lb (91.6 kg)  ?03/09/21 201 lb (91.2 kg)  ?11/16/20 205 lb (93 kg)  ? ? ?   ?Assessment & Plan:  ? ?Problem List Items Addressed This Visit   ?None ? ? ? ? ?No orders of the defined types were placed in this encounter. ? ? ?I, Carylon Perches, personally preformed the services described in this documentation.  All medical record entries made by the scribe were at  my direction and in my presence.  I have reviewed the chart and discharge instructions (if applicable) and agree that the record reflects my personal performance and is accurate and complete. 11/13/2021 ? ? ?I,Amber Collins,acting as a Education administrator for [employer], NP.,have documented all relevant documentation on the behalf of Nance Pear, NP,as directed by  Nance Pear, NP while in the presence of Nance Pear, NP. ? ? ? ?[employer] ? ?

## 2021-11-25 ENCOUNTER — Encounter: Payer: Self-pay | Admitting: Family Medicine

## 2021-11-25 DIAGNOSIS — B354 Tinea corporis: Secondary | ICD-10-CM

## 2021-11-26 MED ORDER — TERBINAFINE HCL 250 MG PO TABS: 250.0000 mg | ORAL_TABLET | Freq: Every day | ORAL | 0 refills | Status: DC

## 2021-12-26 ENCOUNTER — Encounter: Payer: Self-pay | Admitting: Family Medicine

## 2021-12-26 NOTE — Progress Notes (Signed)
[facility] ?[address], [address] ?[place], [place] [address] ?336 [phone] ?Fax [phone] ? ?Date:  12/27/2021  ? ?Name:  Paul Mccann   DOB:  01-23-1991   MRN:  [id number] ? ?PCP:  Pearline Cables, MD  ? ? ?Chief Complaint: Hand Pain (Left wrist and hand pian x 1 week. Limited ROM. Pt wears a brace today. ) ? ? ?History of Present Illness: ? ?Paul Mccann is a 31 y.o. very pleasant male patient who presents with the following: ? ?Patient is seen today with concern of wrist pain- LEFT ?Most recent visit with myself was in January, he was also seen in March with illness ? ?Generally in good health, married with 1 child -his son is now 50 months old ? ?The wrist pain seemed to start after he did yard work last weekend- for a few hours ?No injury that can recall, but it has continued to hurt since that day ?Not getting better- it hurts when he twists his hand, and he cannot use it to lift anything ?He bought an OTC brace a few days ago which seems to be helping somewhat ?He is able to work but is mostly using his right hand -he works in Consulting civil engineer, doing computer work ?Otherwise feels well, no other concerns today ?Patient Active Problem List  ? Diagnosis Date Noted  ? Viral upper respiratory tract infection 11/13/2021  ? Fatty liver 10/01/2021  ? Overweight 09/12/2021  ? Prediabetes 09/12/2021  ? ? ?No past medical history on file. ? ?No past surgical history on file. ? ?Social History  ? ?Tobacco Use  ? Smoking status: Never  ? Smokeless tobacco: Never  ?Vaping Use  ? Vaping Use: Never used  ?Substance Use Topics  ? Alcohol use: Not Currently  ? Drug use: Never  ? ? ?Family History  ?Family history unknown: Yes  ? ? ?No Known Allergies ? ?Medication list has been reviewed and updated. ? ?No current outpatient medications on file prior to visit.  ? ?No current facility-administered medications on file prior to visit.  ? ? ?Review of Systems: ? ?.aspesr ? ? ?Physical  Examination: ?Vitals:  ? 12/27/21 1057  ?BP: 118/72  ?Pulse: 79  ?Resp: 18  ?Temp: 98 ?F (36.7 ?C)  ?SpO2: 96%  ? ?Vitals:  ? 12/27/21 1057  ?Weight: 205 lb (93 kg)  ?Height: 5\' 8"  (1.727 m)  ? ?Body mass index is 31.17 kg/m?. ?Ideal Body Weight: Weight in (lb) to have BMI = 25: 164.1 ? ?GEN: no acute distress.  Mildly obese, looks well ?HEENT: Atraumatic, Normocephalic.  ?Ears and Nose: No external deformity. ?CV: RRR, No M/G/R. No JVD. No thrill. No extra heart sounds. ?PULM: CTA B, no wheezes, crackles, rhonchi. No retractions. No resp. distress. No accessory muscle use. ?EXTR: No c/c/e ?PSYCH: Normally interactive. Conversant.  ?Patient has tenderness at the radial aspect of the left wrist.  He is not particularly tender over the scaphoid.  Finkelstein's testing is negative.  There is no heat, swelling or redness.  However when the patient twists his hand there is a palpable snap of the ligament at the base of the thumb ? ?Assessment and Plan: ?Strain of left wrist, initial encounter - Plan: meloxicam (MOBIC) 15 MG tablet, Ambulatory referral to Orthopedic Surgery, DG Wrist Complete Left ?Doing well patient seen today with left wrist strain.  He is currently wearing a decent wrist brace.  Advised a more substantial brace which goes  farther down the forearm make it even more support.  We will treat with meloxicam, splint for immobilization as needed, ice.  Obtain x-rays today.  Referral to orthopedics in case symptoms continue ?He is asked to let me know if not feeling better in the next few days ? ?Signed ?Abbe Amsterdam, MD ? ?

## 2021-12-27 ENCOUNTER — Ambulatory Visit (HOSPITAL_BASED_OUTPATIENT_CLINIC_OR_DEPARTMENT_OTHER)
Admission: RE | Admit: 2021-12-27 | Discharge: 2021-12-27 | Disposition: A | Discharge status: Home / Self Care | Payer: 59 | Source: Ambulatory Visit | Attending: Family Medicine | Admitting: Family Medicine

## 2021-12-27 ENCOUNTER — Ambulatory Visit: Payer: 59 | Admitting: Family Medicine

## 2021-12-27 VITALS — BP 118/72 | HR 79 | Temp 98.0°F | Resp 18 | Ht 68.0 in | Wt 205.0 lb

## 2021-12-27 DIAGNOSIS — S66912A Strain of unspecified muscle, fascia and tendon at wrist and hand level, left hand, initial encounter: Secondary | ICD-10-CM | POA: Diagnosis present

## 2021-12-27 MED ORDER — MELOXICAM 15 MG PO TABS: 15.0000 mg | ORAL_TABLET | Freq: Every day | ORAL | 0 refills | Status: DC

## 2021-12-27 NOTE — Patient Instructions (Signed)
It was good to see you today, I am sorry that your wrist is bothering you.  The wrist splint you have is certainly reasonable, but one that goes a bit farther down your forearm may offer even better support ?Limit use of the wrist as needed for pain ?Meloxicam once daily as needed for pain-perhaps take daily for 5 to 7 days to help calm down inflammation ?Using ice may also help with inflammation and pain ?We will get an x-ray today ?Referral placed to orthopedics for further evaluation.  Okay to cancel appointment if your symptoms should resolve ?Let me know if getting worse  ?

## 2021-12-28 ENCOUNTER — Ambulatory Visit: Payer: 59 | Admitting: Orthopedic Surgery

## 2021-12-28 ENCOUNTER — Encounter: Payer: Self-pay | Admitting: Family Medicine

## 2021-12-28 DIAGNOSIS — M654 Radial styloid tenosynovitis [de Quervain]: Secondary | ICD-10-CM

## 2021-12-28 HISTORY — DX: Radial styloid tenosynovitis (de quervain): M65.4

## 2021-12-28 MED ORDER — LIDOCAINE HCL 1 % IJ SOLN
1.0000 mL | INTRAMUSCULAR | Status: AC | PRN
  Administered 2021-12-28: 1 mL

## 2021-12-28 MED ORDER — BETAMETHASONE SOD PHOS & ACET 6 (3-3) MG/ML IJ SUSP
6.0000 mg | INTRAMUSCULAR | Status: AC | PRN
  Administered 2021-12-28: 6 mg via INTRA_ARTICULAR

## 2021-12-28 NOTE — Progress Notes (Signed)
? ?Office Visit Note ?  ?Patient: Paul Mccann           ?Date of Birth: [birth year]           ?MRN: [id number] ?Visit Date: 12/28/2021 ?             ?Requested by: Pearline Cables, MD ?[address] ?[address] ?[place],  [place] [address] ?PCP: Pearline Cables, MD ? ? ?Assessment & Plan: ?Visit Diagnoses:  ?1. De Quervain's tenosynovitis, left   ? ? ?Plan: We reviewed the nature of de Quervain's tenosynovitis as well as its diagnosis, prognosis, and both conservative and surgical treatment options.  After our discussion, he would like to proceed with corticosteroid injection and a thumb spica brace.  I can see him back in the office in 4 to 6 weeks if he is still symptomatic. ? ?Follow-Up Instructions: No follow-ups on file.  ? ?Orders:  ?No orders of the defined types were placed in this encounter. ? ?No orders of the defined types were placed in this encounter. ? ? ? ? Procedures: ?Hand/UE Inj: L extensor compartment 1 for de Quervain's tenosynovitis on 12/28/2021 1:41 PM ?Indications: tendon swelling and therapeutic ?Details: 25 G needle, radial approach ?Medications: 1 mL lidocaine 1 %; 6 mg betamethasone acetate-betamethasone sodium phosphate 6 (3-3) MG/ML ?Procedure, treatment alternatives, risks and benefits explained, specific risks discussed. Consent was given by the patient. Immediately prior to procedure a time out was called to verify the correct patient, procedure, equipment, support staff and site/side marked as required. Patient was prepped and draped in the usual sterile fashion.  ? ? ? ? ?Clinical Data: ?No additional findings. ? ? ?Subjective: ?Chief Complaint  ?Patient presents with  ? Left Wrist - Pain  ?  NKI, Pain: 7/10 with movement, onset x 1 week. When he twisted it it popped, pain seems to be at the base of the thumb.  ? ? 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  Is been going on for about 2 weeks  now.  He denies any injury to the wrist.  He does note that he did some heavy yard work just prior to noticing the pain.  The pain is localized to the area around the radial styloid.  His pain is worse with activities that involve made motion of the thumb.  His pain is 7/10 at worst with prolonged activity.  He does feel a subtle "pop" with forearm rotation.  He denies pain at the dorsal ulnar aspect of the wrist. ? ? ?Review of Systems ? ? ?Objective: ?Vital Signs: There were no vitals taken for this visit. ? ?Physical Exam ?Constitutional:   ?   Appearance: Normal appearance.  ?Cardiovascular:  ?   Rate and Rhythm: Normal rate.  ?   Pulses: Normal pulses.  ?Pulmonary:  ?   Effort: Pulmonary effort is normal.  ?Skin: ?   General: Skin is warm and dry.  ?   Capillary Refill: Capillary refill takes less than 2 seconds.  ?Neurological:  ?   Mental Status: He is alert.  ? ? ?Left Hand Exam  ? ?Tenderness  ?Left hand tenderness location: TTP over radial styloid w/ mild to moderate swelling.  ? ?Other  ?Erythema: absent ?Sensation: normal ?Pulse: present ? ?Comments:  Reproduction of pain w/ Finkelstein test.  Negative [facility] grind test.  No thumb triggering. No pain w/ palpation of dorsal  or ulnar aspect of wrist.  Full wrist ROM.  ? ? ? ? ?Specialty Comments:  ?No specialty comments available. ? ?Imaging: ?No results found. ? ? ?PMFS History: ?Patient Active Problem List  ? Diagnosis Date Noted  ? De Quervain's tenosynovitis, left 12/28/2021  ? Viral upper respiratory tract infection 11/13/2021  ? Fatty liver 10/01/2021  ? Overweight 09/12/2021  ? Prediabetes 09/12/2021  ? ?No past medical history on file.  ?Family History  ?Family history unknown: Yes  ?  ?No past surgical history on file. ?Social History  ? ?Occupational History  ? Not on file  ?Tobacco Use  ? Smoking status: Never  ? Smokeless tobacco: Never  ?Vaping Use  ? Vaping Use: Never used  ?Substance and Sexual Activity  ? Alcohol use: Not Currently  ? Drug  use: Never  ? Sexual activity: Not on file  ? ? ? ? ? ? ?

## 2022-08-29 ENCOUNTER — Encounter: Payer: Self-pay | Admitting: Family Medicine

## 2022-08-29 ENCOUNTER — Ambulatory Visit: Payer: 59 | Admitting: Family Medicine

## 2022-08-29 VITALS — BP 110/60 | HR 86 | Temp 97.8°F | Resp 18 | Ht 68.0 in | Wt 203.6 lb

## 2022-08-29 DIAGNOSIS — R051 Acute cough: Secondary | ICD-10-CM

## 2022-08-29 DIAGNOSIS — J020 Streptococcal pharyngitis: Secondary | ICD-10-CM | POA: Diagnosis not present

## 2022-08-29 DIAGNOSIS — R509 Fever, unspecified: Secondary | ICD-10-CM

## 2022-08-29 LAB — POCT RAPID STREP A (OFFICE): Rapid Strep A Screen: POSITIVE — AB

## 2022-08-29 LAB — POC COVID19 BINAXNOW: SARS Coronavirus 2 Ag: NEGATIVE

## 2022-08-29 MED ORDER — PENICILLIN V POTASSIUM 500 MG PO TABS: 500.0000 mg | ORAL_TABLET | Freq: Two times a day (BID) | ORAL | 0 refills | Status: DC

## 2022-08-29 MED ORDER — OSELTAMIVIR PHOSPHATE 75 MG PO CAPS
75.0000 mg | ORAL_CAPSULE | Freq: Two times a day (BID) | ORAL | 0 refills | Status: DC
Start: 2022-08-29 — End: 2023-07-01

## 2022-08-29 NOTE — Telephone Encounter (Signed)
Initial Comment Caller states he needs to make an appointment. He has a cough, chest and back pain. Fever last temp reading 97.9 Translation No Nurse Assessment Nurse: Radford Pax, RN, Eugene Garnet Date/Time (Eastern Time): 08/29/2022 8:08:59 AM Confirm and document reason for call. If symptomatic, describe symptoms. ---He has a cough, chest and back pain. Does the patient have any new or worsening symptoms? ---Yes Will a triage be completed? ---Yes Related visit to physician within the last 2 weeks? ---No Does the PT have any chronic conditions? (i.e. diabetes, asthma, this includes High risk factors for pregnancy, etc.) ---No Is this a behavioral health or substance abuse call? ---No Guidelines Guideline Title Affirmed Question Affirmed Notes Nurse Date/Time (Eastern Time) Cough - Acute NonProductive Chest pain (Exception: MILD central chest pain, present only when coughing.) Turner, RN, Rebekah 08/29/2022 8:10:08 AM Disp. Time Eilene Ghazi Time) Disposition Final User 08/29/2022 8:05:09 AM Send to Urgent Queue Lelon Mast 08/29/2022 8:13:08 AM Go to ED Now Yes Radford Pax, RN, Eugene Garnet PLEASE NOTE: All timestamps contained within this report are represented as [place] Standard Time. CONFIDENTIALTY NOTICE: This fax transmission is intended only for the addressee. It contains information that is legally privileged, confidential or otherwise protected from use or disclosure. If you are not the intended recipient, you are strictly prohibited from reviewing, disclosing, copying using or disseminating any of this information or taking any action in reliance on or regarding this information. If you have received this fax in error, please notify us immediately by telephone so that we can arrange for its return to [place]. Phone: [phone], Toll-Free: [phone], Fax: [phone] Page: 2 of 2 Call Id: [id number] Final Disposition 08/29/2022 8:13:08 AM Go to ED Now Yes Radford Pax, RN, Sharion Settler  Disagree/Comply Comply Caller Understands Yes PreDisposition Call Doctor Care Advice Given Per Guideline GO TO ED NOW: * You need to be seen in the Emergency Department. CALL EMS 911 IF: * Severe difficulty breathing occurs CARE ADVICE given per Cough - Acute Non-Productive (Adult) guideline. Referrals [facility]

## 2022-08-29 NOTE — Telephone Encounter (Signed)
Pt has been seen today at 9:40 am by Dr Lorelei Pont.

## 2022-08-29 NOTE — Progress Notes (Signed)
[place] [facility] [address] [place], [place] [address] [phone]  Date:  08/29/2022   Name:  Paul Mccann   DOB:  1991/03/07   MRN:  [id number]  PCP:  Darreld Mclean, MD    Chief Complaint: URI (Sxs started last night and include: fever, runny nose, cough- chest and back pain with cough. At home covid test was negative. )   History of Present Illness:  Paul Mccann is a 32 y.o. very pleasant male patient who presents with the following:  Patient seen today for sick visit Generally in good health, history of prediabetes, fatty liver.  His wife has been ill recently with flulike symptoms.  Unfortunately we are not able to test for flu at point-of-care due to lack of flu testing kits His wife also was not able to be tested  Pt notes onset of illness last night- he has a painful cough  Mild fever last night up to about 99 Body aches, severe fatigue  Mild ST No vomiting or diarrhea  He took some OTC cough med this am   His wife is ill as well- she got sick last week He took a covid test last night and it was negative   Patient Active Problem List   Diagnosis Date Noted   De Quervain's tenosynovitis, left 12/28/2021   Viral upper respiratory tract infection 11/13/2021   Fatty liver 10/01/2021   Overweight 09/12/2021   Prediabetes 09/12/2021    No past medical history on file.  No past surgical history on file.  Social History   Tobacco Use   Smoking status: Never   Smokeless tobacco: Never  Vaping Use   Vaping Use: Never used  Substance Use Topics   Alcohol use: Not Currently   Drug use: Never    Family History  Family history unknown: Yes    No Known Allergies  Medication list has been reviewed and updated.  No current outpatient medications on file prior to visit.   No current facility-administered medications on file prior to visit.    Review of Systems:  As per HPI- otherwise  negative.   Physical Examination: Vitals:   08/29/22 0944  BP: 110/60  Pulse: 86  Resp: 18  Temp: 97.8 F (36.6 C)  SpO2: 97%   Vitals:   08/29/22 0944  Weight: 203 lb 9.6 oz (92.4 kg)  Height: 5\' 8"  (1.727 m)   Body mass index is 30.96 kg/m. Ideal Body Weight: Weight in (lb) to have BMI = 25: 164.1  GEN: no acute distress.  Obese, appears mildly ill like he could have influenza HEENT: Atraumatic, Normocephalic.   Bilateral TM wnl, oropharynx erythematous otherwise normal.  PEERL,EOMI.   Ears and Nose: No external deformity. CV: RRR, No M/G/R. No JVD. No thrill. No extra heart sounds. PULM: CTA B, no wheezes, crackles, rhonchi. No retractions. No resp. distress. No accessory muscle use. ABD: S, NT, ND. No rebound. No HSM. EXTR: No c/c/e PSYCH: Normally interactive. Conversant.   Results for orders placed or performed in visit on 08/29/22  POCT rapid strep A  Result Value Ref Range   Rapid Strep A Screen Positive (A) Negative  POC COVID-19 BinaxNow  Result Value Ref Range   SARS Coronavirus 2 Ag Negative Negative    Assessment and Plan: Low grade fever - Plan: POCT rapid strep A  Acute cough - Plan: POC COVID-19 BinaxNow, oseltamivir (TAMIFLU) 75 MG capsule  Strep pharyngitis - Plan: penicillin v potassium (VEETID) 500 MG tablet  Patient seen today with concern of sore throat, low-grade fever.  Unfortunately we are not able to do a flu test for him today.  A rapid strep is positive, but results are very faint.  Will treat him with penicillin, also Tamiflu presuming he may have flu.  Repeat COVID is negative Rest, fluids, over-the-counter medications as needed.  He is asked to let me know if not feeling better in the next couple of days-seek care if getting worse  Signed Lamar Blinks, MD

## 2022-08-29 NOTE — Patient Instructions (Addendum)
I am sorry you are feeling bad!  Rest, fluids, OTC meds as needed Please let me know if you are not feeling better in the next few days- Sooner if worse.  We will treat you for both flu and strep Stay home from work until feeling better for 24 hours, no fever for 24 hours

## 2022-09-04 MED ORDER — PREDNISONE 20 MG PO TABS: ORAL_TABLET | ORAL | 0 refills | Status: DC

## 2022-09-04 MED ORDER — HYDROCODONE BIT-HOMATROP MBR 5-1.5 MG/5ML PO SOLN: 5.0000 mL | Freq: Three times a day (TID) | ORAL | 0 refills | Status: AC | PRN

## 2023-07-01 NOTE — Patient Instructions (Addendum)
It was great to see you again today, I will be in touch with your lab results ASAP  For your other concern, try taking sildenafil (generic viagra) 20 mg as needed about an hour prior to intercourse.  You can take anywhere from 1 to 5 pills (20 -100 mg)-adjust dose as needed.  Please let me know if this is not helping you or if you develop any side effects such as headache  Please let me know if the chest discomfort you have noticed continues or gets worse- we can look further as needed. Wait a week or so and make sure the chest pain does not continue before you start on the sildenafil '  Recommend covid booster at your pharmacy

## 2023-07-01 NOTE — Progress Notes (Addendum)
[facility] [address] [place], [place] [address] [phone]  Date:  07/09/2023   Name:  Paul Mccann   DOB:  1990-10-02   MRN:  [id number]  PCP:  Pearline Cables, MD    Chief Complaint: Annual Exam (Concerns/ questions: none/HIV screen due/Flu shot today: will wait)   History of Present Illness:  Paul Mccann is a 32 y.o. very pleasant male patient who presents with the following:  Patient seen today for physical exam-history of overweight, prediabetes, fatty liver Most recent visit with myself was in January for sick visit-at that time he tested positive for strep throat Since our last visit he has overall been well  Married to [place], they have a young son- he is now 62 years old They are starting to think about another baby  Flu shot- give today  COVID booster-recommended Lab results 1 year ago  He enjoys walking for exercise Never a smoker, does not drink etoh   He has noted some ED sx for 6-12 months- notes he can generally have intercourse once but the 2nd time his erectile quality is low, he sometimes has issues the first time they try as well.  He has not tried any medication for this as of yet.  He notes no pain, normal sexual desire, no genital changes  Paul Mccann also notes some nonspecific chest pain.  He is not quite sure how long this has been going on, perhaps for a couple of weeks  He is not very clear on frequency of pain, he would estimate once or twice a week.  He notes the pain may last for a few seconds, on the left side of his chest.  It is not associated with exertion. No getting any SOB Most recent episode of chest pain was last week No known history of heart disease   Patient Active Problem List   Diagnosis Date Noted   De Quervain's tenosynovitis, left 12/28/2021   Viral upper respiratory tract infection 11/13/2021   Fatty liver 10/01/2021   Overweight 09/12/2021   Prediabetes  09/12/2021    No past medical history on file.  No past surgical history on file.  Social History   Tobacco Use   Smoking status: Never   Smokeless tobacco: Never  Vaping Use   Vaping status: Never Used  Substance Use Topics   Alcohol use: Not Currently   Drug use: Never    Family History  Family history unknown: Yes    No Known Allergies  Medication list has been reviewed and updated.  No current outpatient medications on file prior to visit.   No current facility-administered medications on file prior to visit.    Review of Systems:  As per HPI- otherwise negative.   Physical Examination: Vitals:   07/09/23 0916  BP: 132/70  Pulse: 75  Resp: 18  Temp: 98.2 F (36.8 C)  SpO2: 97%   Vitals:   07/09/23 0916  Weight: 197 lb (89.4 kg)  Height: 5\' 8"  (1.727 m)   Body mass index is 29.95 kg/m. Ideal Body Weight: Weight in (lb) to have BMI = 25: 164.1  GEN: no acute distress.  Overweight, looks well HEENT: Atraumatic, Normocephalic.  Bilateral TM wnl, oropharynx normal.  PEERL,EOMI.   Ears and Nose: No external deformity. CV: RRR, No M/G/R. No JVD. No thrill. No extra heart sounds. PULM: CTA B, no wheezes, crackles, rhonchi. No retractions. No resp. distress.  No accessory muscle use. ABD: S, NT, ND, +BS. No rebound. No HSM. EXTR: No c/c/e PSYCH: Normally interactive. Conversant.   EKG: Normal sinus rhythm Assessment and Plan: Physical exam  Prediabetes - Plan: Comprehensive metabolic panel, Hemoglobin A1c  Overweight  Screening for deficiency anemia - Plan: CBC  Screening for hyperlipidemia - Plan: Lipid panel  Chest pain, unspecified type - Plan: EKG 12-Lead  Erectile dysfunction, unspecified erectile dysfunction type - Plan: sildenafil (REVATIO) 20 MG tablet, DISCONTINUED: sildenafil (REVATIO) 20 MG tablet  Immunization due - Plan: Flu vaccine trivalent PF, 6mos and older(Flulaval,Afluria,Fluarix,Fluzone)  Physical exam today.  Encouraged  diet and exercise routine Flu shot, routine lab work pending as above  Atypical chest pain-given age lower suspicion of cardiac disease.  EKG today is normal, pain most recently occurred about a week ago.  I have asked him to let me know if this continues to occur.  If it does I will set him up for a treadmill.  I offered to get a chest x-ray today, he declines for right now  Erectile dysfunction; advised patient that a lot of men in their 30s are not able to have intercourse more than once per day.  However, because he is having some erectile quality issues he is given prescription for sildenafil.  I did ask him to wait on using this until we make sure the chest pain resolves  For your other concern, try taking sildenafil (generic viagra) 20 mg as needed about an hour prior to intercourse.  You can take anywhere from 1 to 5 pills (20 -100 mg)-adjust dose as needed.  Please let me know if this is not helping you or if you develop any side effects such as headache Signed Abbe Amsterdam, MD  Received labs as below, message to pt  Results for orders placed or performed in visit on 07/09/23  CBC  Result Value Ref Range   WBC 8.2 4.0 - 10.5 K/uL   RBC 5.05 4.22 - 5.81 Mil/uL   Platelets 338.0 150.0 - 400.0 K/uL   Hemoglobin 15.2 13.0 - 17.0 g/dL   HCT 66.4 40.3 - 47.4 %   MCV 88.8 78.0 - 100.0 fl   MCHC 33.9 30.0 - 36.0 g/dL   RDW 25.9 56.3 - 87.5 %  Comprehensive metabolic panel  Result Value Ref Range   Sodium 138 135 - 145 mEq/L   Potassium 4.3 3.5 - 5.1 mEq/L   Chloride 100 96 - 112 mEq/L   CO2 31 19 - 32 mEq/L   Glucose, Bld 94 70 - 99 mg/dL   BUN 16 6 - 23 mg/dL   Creatinine, Ser 6.43 0.40 - 1.50 mg/dL   Total Bilirubin 0.8 0.2 - 1.2 mg/dL   Alkaline Phosphatase 69 39 - 117 U/L   AST 24 0 - 37 U/L   ALT 49 0 - 53 U/L   Total Protein 7.5 6.0 - 8.3 g/dL   Albumin 5.0 3.5 - 5.2 g/dL   GFR 329.51 >88.41 mL/min   Calcium 10.0 8.4 - 10.5 mg/dL  Hemoglobin Y6A  Result Value Ref  Range   Hgb A1c MFr Bld 6.1 4.6 - 6.5 %  Lipid panel  Result Value Ref Range   Cholesterol 253 (H) 0 - 200 mg/dL   Triglycerides 630.1 (H) 0.0 - 149.0 mg/dL   HDL 60.10 (L) >93.23 mg/dL   VLDL 55.7 (H) 0.0 - 32.2 mg/dL   LDL Cholesterol 025 (H) 0 - 99 mg/dL   Total CHOL/HDL Ratio 7  NonHDL 214.00

## 2023-07-02 ENCOUNTER — Encounter: Payer: 59 | Admitting: Family Medicine

## 2023-07-09 ENCOUNTER — Encounter: Payer: Self-pay | Admitting: Family Medicine

## 2023-07-09 ENCOUNTER — Ambulatory Visit: Payer: 59 | Admitting: Family Medicine

## 2023-07-09 VITALS — BP 132/70 | HR 75 | Temp 98.2°F | Resp 18 | Ht 68.0 in | Wt 197.0 lb

## 2023-07-09 DIAGNOSIS — E663 Overweight: Secondary | ICD-10-CM

## 2023-07-09 DIAGNOSIS — N529 Male erectile dysfunction, unspecified: Secondary | ICD-10-CM

## 2023-07-09 DIAGNOSIS — R079 Chest pain, unspecified: Secondary | ICD-10-CM | POA: Diagnosis not present

## 2023-07-09 DIAGNOSIS — R7303 Prediabetes: Secondary | ICD-10-CM | POA: Diagnosis not present

## 2023-07-09 DIAGNOSIS — Z130 Encounter for screening for diseases of the blood and blood-forming organs and certain disorders involving the immune mechanism: Secondary | ICD-10-CM

## 2023-07-09 DIAGNOSIS — Z23 Encounter for immunization: Secondary | ICD-10-CM | POA: Diagnosis not present

## 2023-07-09 DIAGNOSIS — Z0000 Encounter for general adult medical examination without abnormal findings: Secondary | ICD-10-CM | POA: Diagnosis not present

## 2023-07-09 DIAGNOSIS — Z13220 Encounter for screening for lipoid disorders: Secondary | ICD-10-CM | POA: Diagnosis not present

## 2023-07-09 LAB — CBC
HCT: 44.9 % (ref 39.0–52.0)
Hemoglobin: 15.2 g/dL (ref 13.0–17.0)
MCHC: 33.9 g/dL (ref 30.0–36.0)
MCV: 88.8 fL (ref 78.0–100.0)
Platelets: 338 10*3/uL (ref 150.0–400.0)
RBC: 5.05 Mil/uL (ref 4.22–5.81)
RDW: 12.7 % (ref 11.5–15.5)
WBC: 8.2 10*3/uL (ref 4.0–10.5)

## 2023-07-09 LAB — LIPID PANEL
Cholesterol: 253 mg/dL — ABNORMAL HIGH (ref 0–200)
HDL: 38.9 mg/dL — ABNORMAL LOW (ref >39.00)
LDL Cholesterol: 141 mg/dL — ABNORMAL HIGH (ref 0–99)
NonHDL: 214
Total CHOL/HDL Ratio: 7
Triglycerides: 366 mg/dL — ABNORMAL HIGH (ref 0.0–149.0)
VLDL: 73.2 mg/dL — ABNORMAL HIGH (ref 0.0–40.0)

## 2023-07-09 LAB — HEMOGLOBIN A1C: Hgb A1c MFr Bld: 6.1 % (ref 4.6–6.5)

## 2023-07-09 LAB — COMPREHENSIVE METABOLIC PANEL
ALT: 49 U/L (ref 0–53)
AST: 24 U/L (ref 0–37)
Albumin: 5 g/dL (ref 3.5–5.2)
Alkaline Phosphatase: 69 U/L (ref 39–117)
BUN: 16 mg/dL (ref 6–23)
CO2: 31 meq/L (ref 19–32)
Calcium: 10 mg/dL (ref 8.4–10.5)
Chloride: 100 meq/L (ref 96–112)
Creatinine, Ser: 0.99 mg/dL (ref 0.40–1.50)
GFR: 100.88 mL/min (ref >60.00)
Glucose, Bld: 94 mg/dL (ref 70–99)
Potassium: 4.3 meq/L (ref 3.5–5.1)
Sodium: 138 meq/L (ref 135–145)
Total Bilirubin: 0.8 mg/dL (ref 0.2–1.2)
Total Protein: 7.5 g/dL (ref 6.0–8.3)

## 2023-07-09 MED ORDER — SILDENAFIL CITRATE 20 MG PO TABS: ORAL_TABLET | ORAL | 0 refills | Status: DC

## 2023-07-09 MED ORDER — SILDENAFIL CITRATE 20 MG PO TABS: ORAL_TABLET | ORAL | 2 refills | Status: DC

## 2023-09-13 IMAGING — DX DG WRIST COMPLETE 3+V*L*
4 series · 4 of 4 positions shown · non-contrast
Comparison: None Available.

CLINICAL DATA: Pain in left wrist, mid to radial side. Overuse
injury.

EXAM:
LEFT WRIST - COMPLETE 3+ VIEW

[wrist pa]
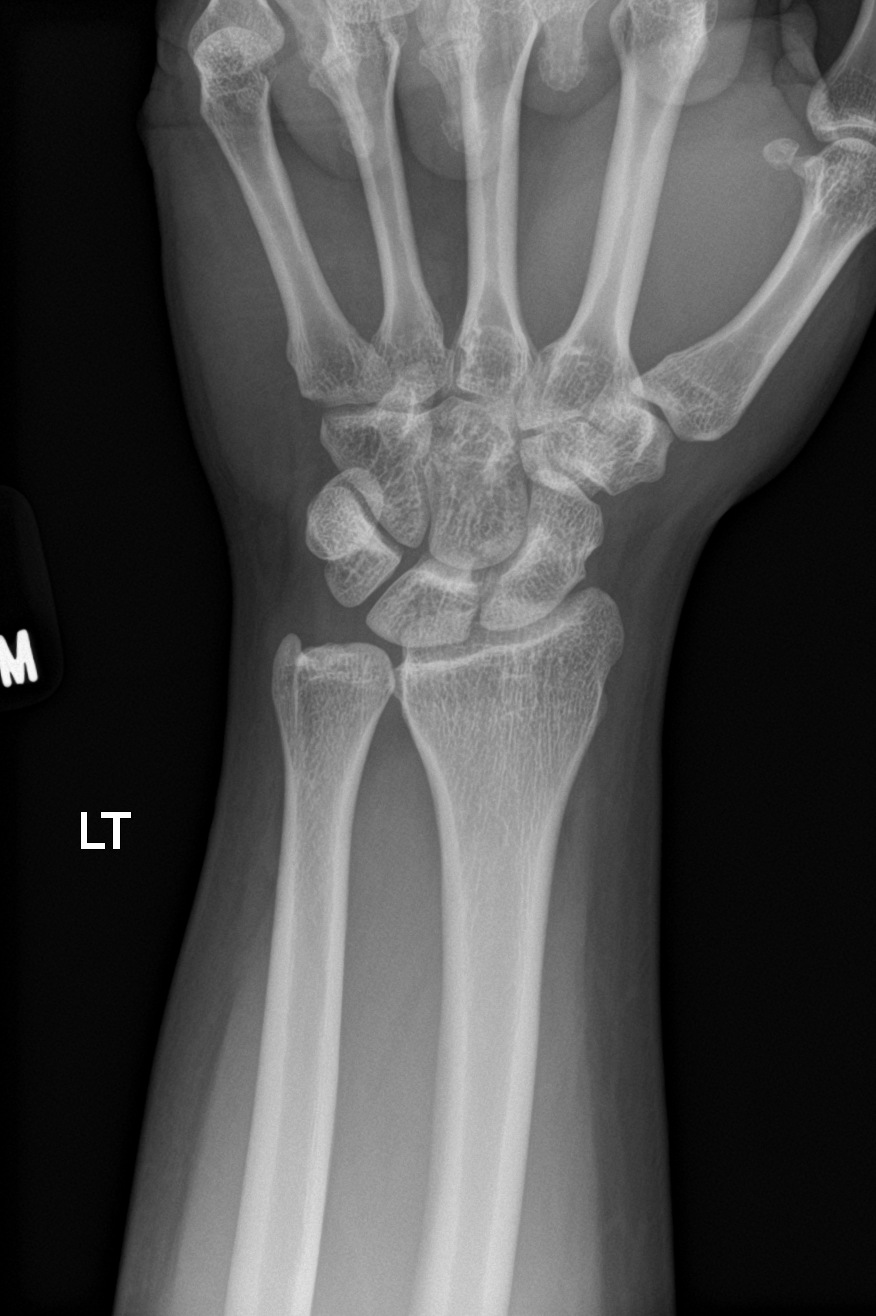

[wrist obl]
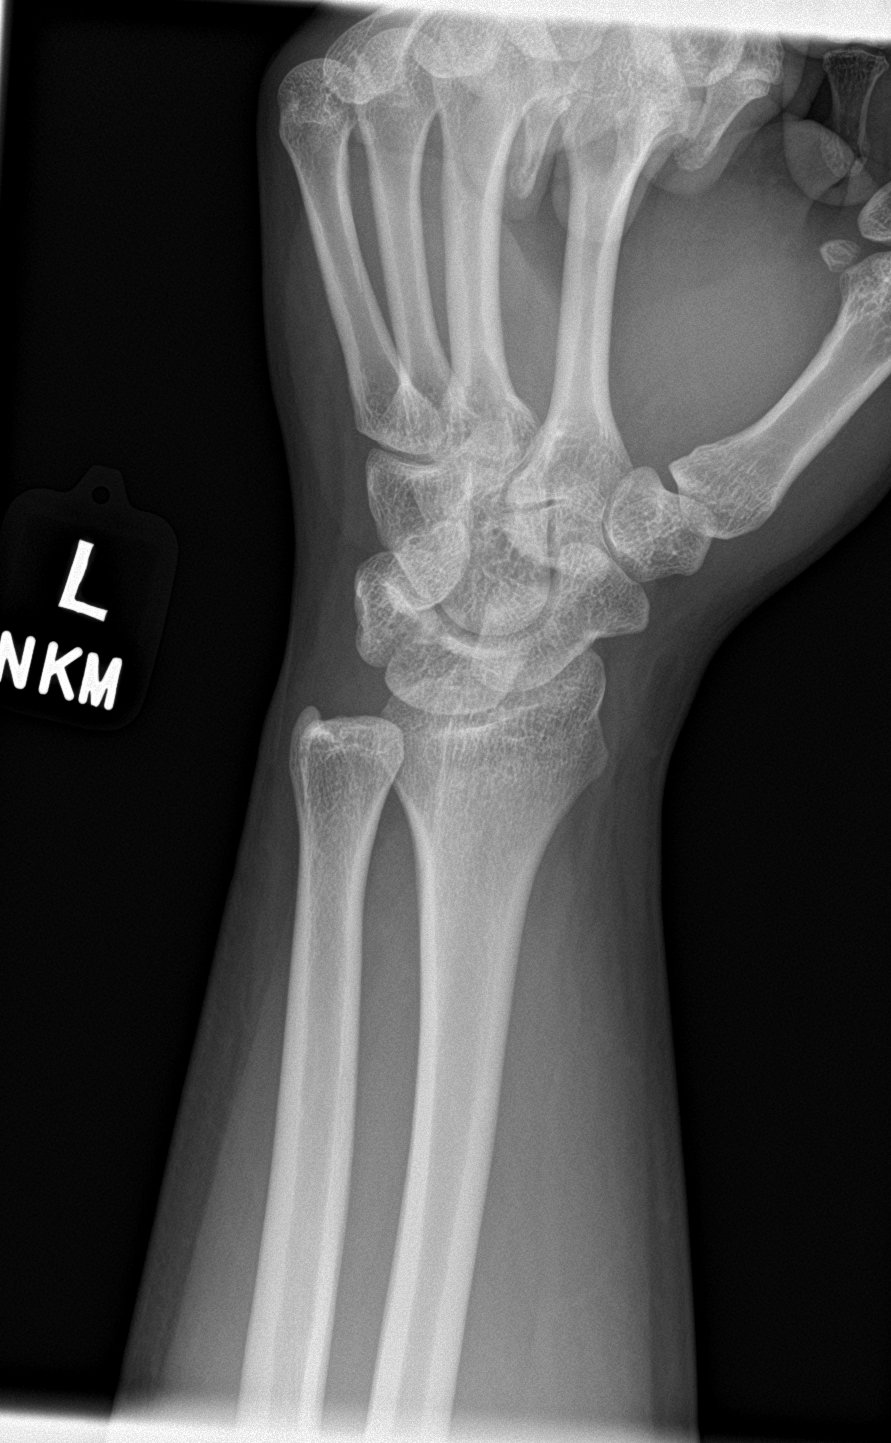

[wrist lat]
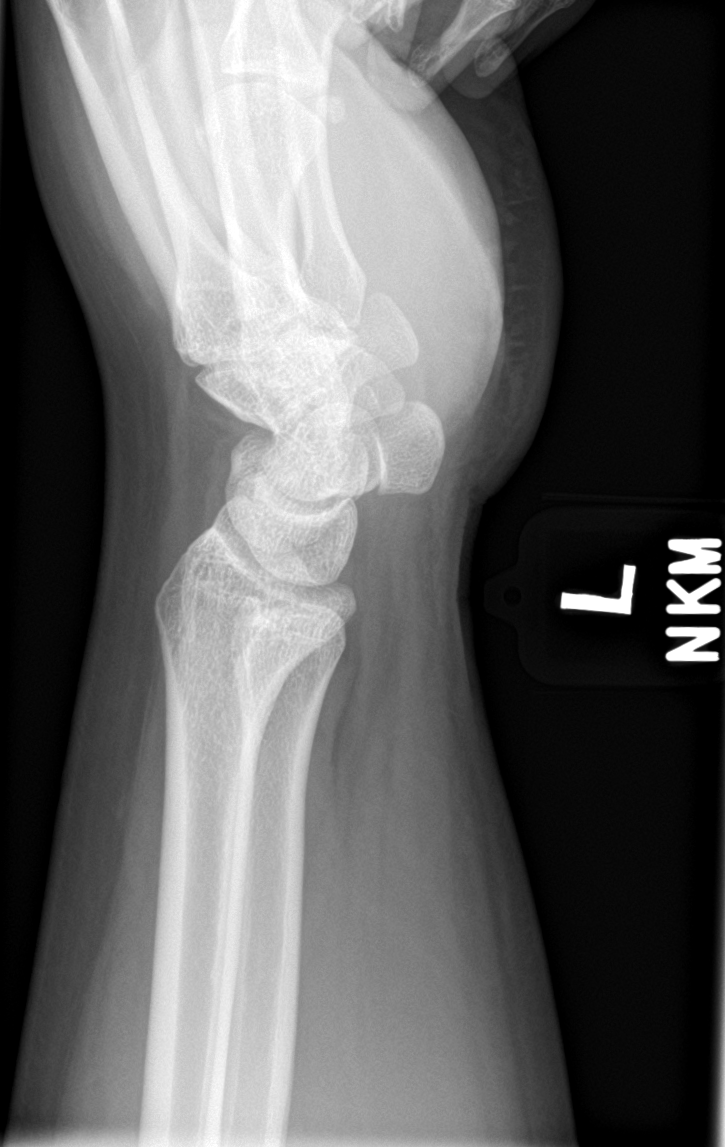

[wrist navicular]
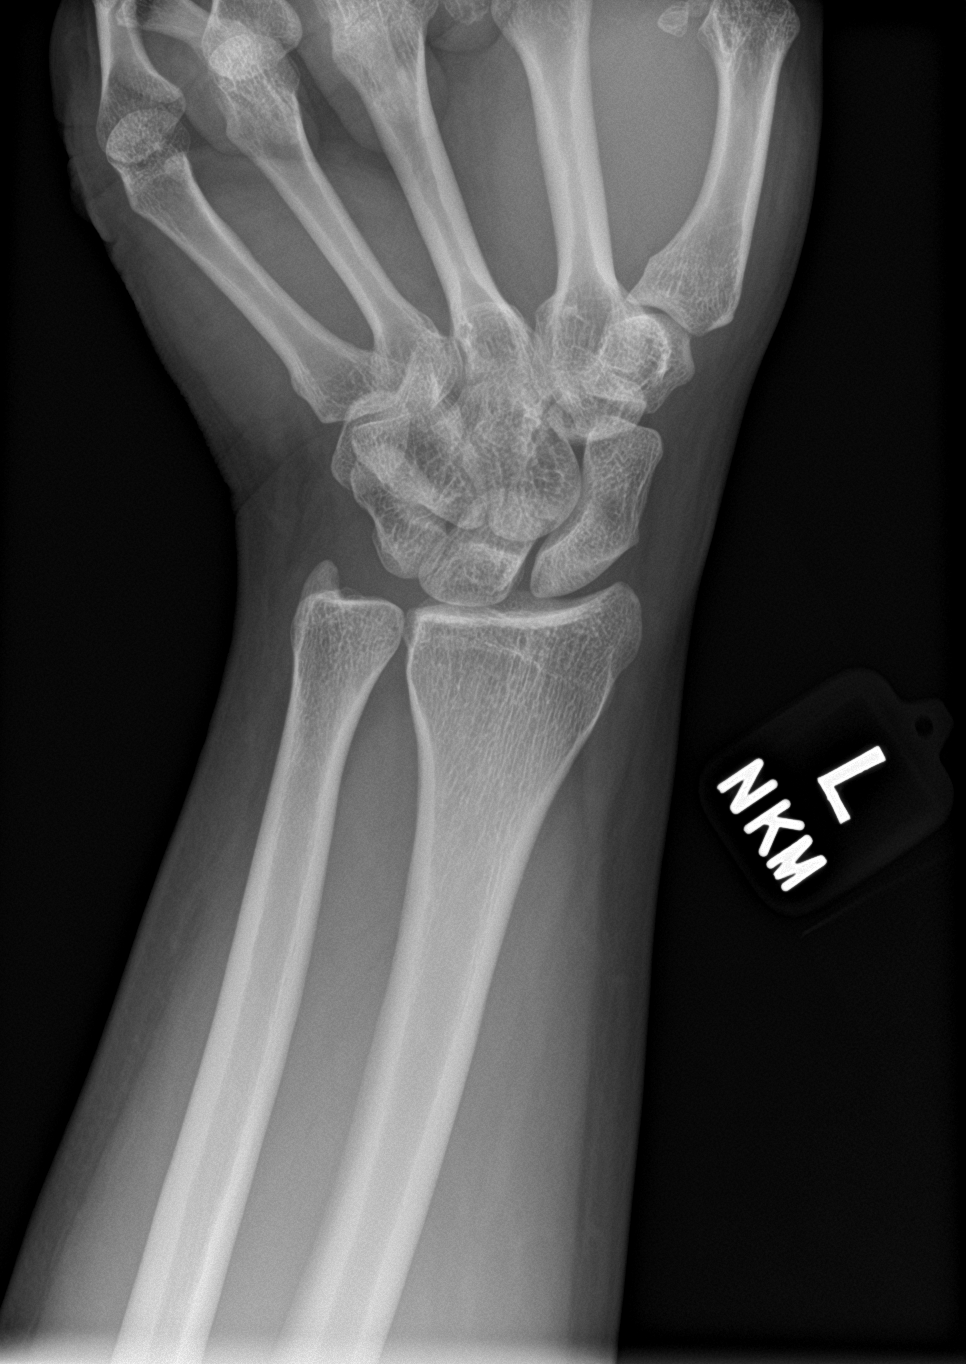

[4 of 4 positions shown; findings below may reference images not displayed]

FINDINGS: There is no evidence of fracture or dislocation. There is no
evidence of arthropathy or other focal bone abnormality. Soft
tissues are unremarkable.
IMPRESSION: Negative.

## 2023-10-23 NOTE — Progress Notes (Signed)
 [facility] [address] [place], [place] [address] [phone]  Date:  10/29/2023   Name:  Paul Mccann   DOB:  1991/05/28   MRN:  [id number]  PCP:  Pearline Cables, MD    Chief Complaint: pain in the chest (L side x last OV. More frequent. EKG done at last OV. /HIV screen due)   History of Present Illness:  Paul Mccann is a 33 y.o. very pleasant male patient who presents with the following:  Patient seen today with concern of pain in his chest Most recent visit with myself was in November for his physical History of overweight, prediabetes, fatty liver, hyperlipidemia  He actually mentioned atypical chest pain to me back in November.  EKG was normal at that time. Pt notes the only change in the interim is increase in frequency He will have pain under the left breast area He describes a sharp pain May last 5-10 seconds, occurs approx 3-4x a week- not always consistent in frequency  No pattern that he can ascertain- not typically exertional  He walks for exercise typically 20- 25 minutes 5  days a week   Flu vaccine is up-to-date Recommend COVID booster Lab work in [employer] and A1c 6.1% We can recheck PSA and lipids today  His wife is due with a baby boy in August- they have a 33 yo as well at home!  Patient Active Problem List   Diagnosis Date Noted   De Quervain's tenosynovitis, left 12/28/2021   Viral upper respiratory tract infection 11/13/2021   Fatty liver 10/01/2021   Overweight 09/12/2021   Prediabetes 09/12/2021    No past medical history on file.  No past surgical history on file.  Social History   Tobacco Use   Smoking status: Never   Smokeless tobacco: Never  Vaping Use   Vaping status: Never Used  Substance Use Topics   Alcohol use: Not Currently   Drug use: Never    Family History  Family history unknown: Yes    No Known Allergies  Medication list has been  reviewed and updated.  Current Outpatient Medications on File Prior to Visit  Medication Sig Dispense Refill   sildenafil (REVATIO) 20 MG tablet Take 1-5 pills (20- 100 mg) daily as needed for ED symptoms 20 tablet 2   No current facility-administered medications on file prior to visit.    Review of Systems:  As per HPI- otherwise negative.   Physical Examination: Vitals:   10/29/23 1255  BP: 110/60  Pulse: 100  Resp: 18  Temp: 98.1 F (36.7 C)  SpO2: 95%   Vitals:   10/29/23 1255  Weight: 197 lb 12.8 oz (89.7 kg)  Height: 5\' 8"  (1.727 m)   Body mass index is 30.08 kg/m. Ideal Body Weight: Weight in (lb) to have BMI = 25: 164.1  GEN: no acute distress. Mild obesity, looks well  HEENT: Atraumatic, Normocephalic.  Ears and Nose: No external deformity. CV: RRR, No M/G/R. No JVD. No thrill. No extra heart sounds. PULM: CTA B, no wheezes, crackles, rhonchi. No retractions. No resp. distress. No accessory muscle use. ABD: S, NT, ND No rebound. No HSM. EXTR: No c/c/e PSYCH: Normally interactive. Conversant.  Cannot reproduce chest pain by pressing on overlying ribs   EKG: SR, rate 98.  Stable from previous except for downgoing T wave in II and perhaps AvF  Wt Readings from Last 3 Encounters:  10/29/23 197 lb 12.8 oz (89.7 kg)  07/09/23 197 lb (89.4 kg)  08/29/22 203 lb 9.6 oz (92.4 kg)   Assessment and Plan: Atypical chest pain - Plan: EKG 12-Lead, DG Ribs Unilateral W/Chest Left, Exercise Tolerance Test, Cardiac Stress Test: Informed Consent Details: Physician/Practitioner Attestation; Transcribe to consent form and obtain patient signature  Patient seen today for follow-up of atypical chest pain.  He also mentioned this to me 4 months ago.  His chest pain does not sound cardiac in nature, but given persistence we will do further evaluation.  Will obtain x-rays of his ribs and chest and set up for a treadmill test.  [name] will let me know if any worsening or change of  his symptoms in the meantime Signed Abbe Amsterdam, MD

## 2023-10-29 ENCOUNTER — Ambulatory Visit (HOSPITAL_BASED_OUTPATIENT_CLINIC_OR_DEPARTMENT_OTHER)
Admission: RE | Admit: 2023-10-29 | Discharge: 2023-10-29 | Disposition: A | Discharge status: Home / Self Care | Source: Ambulatory Visit | Attending: Family Medicine | Admitting: Family Medicine

## 2023-10-29 ENCOUNTER — Ambulatory Visit: Payer: 59 | Admitting: Family Medicine

## 2023-10-29 ENCOUNTER — Encounter: Payer: Self-pay | Admitting: Family Medicine

## 2023-10-29 VITALS — BP 110/60 | HR 100 | Temp 98.1°F | Resp 18 | Ht 68.0 in | Wt 197.8 lb

## 2023-10-29 DIAGNOSIS — R0789 Other chest pain: Secondary | ICD-10-CM

## 2023-10-29 NOTE — Patient Instructions (Signed)
 We will set you up for a treadmill test to make sure all is ok with your heart I will also get x-rays of your chest and ribs today If anything is changing or getting worse please let me know

## 2023-11-05 ENCOUNTER — Encounter (HOSPITAL_COMMUNITY): Payer: Self-pay

## 2023-11-06 ENCOUNTER — Encounter (HOSPITAL_COMMUNITY): Payer: Self-pay

## 2023-11-07 ENCOUNTER — Ambulatory Visit (HOSPITAL_COMMUNITY): Attending: Cardiology

## 2023-11-07 DIAGNOSIS — R0789 Other chest pain: Secondary | ICD-10-CM | POA: Diagnosis present

## 2023-11-07 LAB — EXERCISE TOLERANCE TEST
Angina Index: 0
Estimated workload: 181
Exercise duration (min): 6 min
Exercise duration (sec): 55 s
MPHR: 188 {beats}/min
Peak HR: 181 {beats}/min
Percent HR: 96 %
Rest HR: 114 {beats}/min

## 2023-11-11 ENCOUNTER — Encounter: Payer: Self-pay | Admitting: Family Medicine

## 2023-11-11 DIAGNOSIS — R0789 Other chest pain: Secondary | ICD-10-CM

## 2023-11-30 ENCOUNTER — Telehealth: Payer: Self-pay | Admitting: Family Medicine

## 2023-11-30 NOTE — Telephone Encounter (Signed)
 Called and LMOM on his phone- please call or respond to mychart message about his heart test

## 2023-12-05 ENCOUNTER — Ambulatory Visit: Admitting: Cardiology

## 2023-12-05 NOTE — Progress Notes (Deleted)
      Clinical Summary Paul Mccann is a 33 y.o.male  1.Chest pain -  -CAD risk factors:   Pcp had ordered stress EKG - 10/2023 GXT: baseline inferior ST depressions/Twaves that worsen with stress. Due to baseline chagnes nonspecific findings.     No past medical history on file.   No Known Allergies   Current Outpatient Medications  Medication Sig Dispense Refill   sildenafil (REVATIO) 20 MG tablet Take 1-5 pills (20- 100 mg) daily as needed for ED symptoms 20 tablet 2   No current facility-administered medications for this visit.     No past surgical history on file.   No Known Allergies    Family History  Family history unknown: Yes     Social History Paul Mccann reports that he has never smoked. He has never used smokeless tobacco. Paul Mccann reports that he does not currently use alcohol.   Review of Systems CONSTITUTIONAL: No weight loss, fever, chills, weakness or fatigue.  HEENT: Eyes: No visual loss, blurred vision, double vision or yellow sclerae.No hearing loss, sneezing, congestion, runny nose or sore throat.  SKIN: No rash or itching.  CARDIOVASCULAR:  RESPIRATORY: No shortness of breath, cough or sputum.  GASTROINTESTINAL: No anorexia, nausea, vomiting or diarrhea. No abdominal pain or blood.  GENITOURINARY: No burning on urination, no polyuria NEUROLOGICAL: No headache, dizziness, syncope, paralysis, ataxia, numbness or tingling in the extremities. No change in bowel or bladder control.  MUSCULOSKELETAL: No muscle, back pain, joint pain or stiffness.  LYMPHATICS: No enlarged nodes. No history of splenectomy.  PSYCHIATRIC: No history of depression or anxiety.  ENDOCRINOLOGIC: No reports of sweating, cold or heat intolerance. No polyuria or polydipsia.  Marland Kitchen   Physical Examination There were no vitals filed for this visit. There were no vitals filed for this visit.  Gen: resting comfortably, no acute distress HEENT: no scleral icterus,  pupils equal round and reactive, no palptable cervical adenopathy,  CV Resp: Clear to auscultation bilaterally GI: abdomen is soft, non-tender, non-distended, normal bowel sounds, no hepatosplenomegaly MSK: extremities are warm, no edema.  Skin: warm, no rash Neuro:  no focal deficits Psych: appropriate affect   Diagnostic Studies     Assessment and Plan        Paul Mccann, M.D., F.A.C.C.

## 2023-12-31 ENCOUNTER — Encounter: Payer: Self-pay | Admitting: Cardiology

## 2023-12-31 ENCOUNTER — Ambulatory Visit: Attending: Cardiology | Admitting: Cardiology

## 2023-12-31 VITALS — BP 116/78 | HR 91 | Ht 68.0 in | Wt 203.2 lb

## 2023-12-31 DIAGNOSIS — R079 Chest pain, unspecified: Secondary | ICD-10-CM

## 2023-12-31 DIAGNOSIS — R7303 Prediabetes: Secondary | ICD-10-CM

## 2023-12-31 DIAGNOSIS — R0789 Other chest pain: Secondary | ICD-10-CM

## 2023-12-31 DIAGNOSIS — E785 Hyperlipidemia, unspecified: Secondary | ICD-10-CM | POA: Insufficient documentation

## 2023-12-31 DIAGNOSIS — E781 Pure hyperglyceridemia: Secondary | ICD-10-CM | POA: Insufficient documentation

## 2023-12-31 DIAGNOSIS — E663 Overweight: Secondary | ICD-10-CM

## 2023-12-31 DIAGNOSIS — R0609 Other forms of dyspnea: Secondary | ICD-10-CM

## 2023-12-31 DIAGNOSIS — R072 Precordial pain: Secondary | ICD-10-CM

## 2023-12-31 HISTORY — DX: Pure hyperglyceridemia: E78.1

## 2023-12-31 HISTORY — DX: Hyperlipidemia, unspecified: E78.5

## 2023-12-31 HISTORY — DX: Other chest pain: R07.89

## 2023-12-31 MED ORDER — METOPROLOL TARTRATE 100 MG PO TABS: ORAL_TABLET | ORAL | 0 refills | Status: DC

## 2023-12-31 NOTE — Addendum Note (Signed)
 Addended by: Shawnee Dellen D on: 12/31/2023 03:22 PM   Modules accepted: Orders

## 2023-12-31 NOTE — Progress Notes (Signed)
 Cardiology Consultation:    Date:  12/31/2023   ID:  Paul Mccann, DOB 18-Jun-1991, MRN [id number]  PCP:  Kaylee Partridge, MD  Cardiologist:  Ralene Burger, MD   Referring MD: Kaylee Partridge, MD   Chief Complaint  Patient presents with   Chest Pain    History of Present Illness:    Paul Mccann is a 33 y.o. male who is being seen today for the evaluation of chest pain at the request of Copland, Skipper Dumas, MD. past medical history significant for prediabetes hemoglobin A1c 6.1%, dyslipidemia with hypertriglyceridemia with LDL 141 triglyceride 366, obesity, he was referred to us  because of chest pain.  He described chest pain located under left breast could be quite severe not related particular to exercises sometimes twisting her body make it a little bit better but usually no matter what he does pain will be there.  There is no sweating associated this sensation not much shortness of breath.  He tried to be active try to exercise on the regular basis walk in the morning sometimes jogging and have no difficulty doing it.  He did have a stress test sadly stress test is equivocal he does have baseline ST segment abnormality that became worse with exercises is difficult to interpret suspicion for ischemia is why he was referred to us .  He does not smoke never did does have family history of coronary artery disease but not premature, he is not on any special diet he does not eat too much sweets he is obese.  History reviewed. No pertinent past medical history.  Past Surgical History:  Procedure Laterality Date   NO PAST SURGERIES      Current Medications: Current Meds  Medication Sig   sildenafil  (REVATIO ) 20 MG tablet Take 1-5 pills (20- 100 mg) daily as needed for ED symptoms (Patient taking differently: Take 20 mg by mouth See admin instructions. Take 1-5 pills (20- 100 mg) daily as needed for ED symptoms)     Allergies:   Patient has no known allergies.   Social History    Socioeconomic History   Marital status: Married    Spouse name: Not on file   Number of children: Not on file   Years of education: Not on file   Highest education level: Not on file  Occupational History   Not on file  Tobacco Use   Smoking status: Never   Smokeless tobacco: Never  Vaping Use   Vaping status: Never Used  Substance and Sexual Activity   Alcohol use: Not Currently   Drug use: Never   Sexual activity: Not on file  Other Topics Concern   Not on file  Social History Narrative   Not on file   Social Drivers of Health   Financial Resource Strain: Not on file  Food Insecurity: Not on file  Transportation Needs: Not on file  Physical Activity: Not on file  Stress: Not on file  Social Connections: Not on file     Family History: The patient's Family history is unknown by patient. ROS:   Please see the history of present illness.    All 14 point review of systems negative except as described per history of present illness.  EKGs/Labs/Other Studies Reviewed:    The following studies were reviewed today: Stress test showed fair exercise capacity 8.3 METS peak heart 881 which is 96% maximum predicted baseline abnormality of the ST-T segment no diagnostic study  EKG:  EKG Interpretation Date/Time:  Wednesday Dec 31 2023 14:42:04 EDT Ventricular Rate:  94 PR Interval:  158 QRS Duration:  88 QT Interval:  330 QTC Calculation: 412 R Axis:   19  Text Interpretation: Normal sinus rhythm T wave abnormality, consider inferior ischemia Abnormal ECG No previous ECGs available Confirmed by Ralene Burger [phone]) on 12/31/2023 2:53:40 PM    Recent Labs: 07/09/2023: ALT 49; BUN 16; Creatinine, Ser 0.99; Hemoglobin 15.2; Platelets 338.0; Potassium 4.3; Sodium 138  Recent Lipid Panel    Component Value Date/Time   CHOL 253 (H) 07/09/2023 0959   TRIG 366.0 (H) 07/09/2023 0959   HDL 38.90 (L) 07/09/2023 0959   CHOLHDL 7 07/09/2023 0959   VLDL 73.2 (H) 07/09/2023  0959   LDLCALC 141 (H) 07/09/2023 0959   LDLDIRECT 174.0 09/12/2021 1318    Physical Exam:    VS:  BP 116/78 (BP Location: Right Arm, Patient Position: Sitting)   Pulse 91   Ht 5\' 8"  (1.727 m)   Wt 203 lb 3.2 oz (92.2 kg)   SpO2 97%   BMI 30.90 kg/m     Wt Readings from Last 3 Encounters:  12/31/23 203 lb 3.2 oz (92.2 kg)  10/29/23 197 lb 12.8 oz (89.7 kg)  07/09/23 197 lb (89.4 kg)     GEN:  Well nourished, well developed in no acute distress HEENT: Normal NECK: No JVD; No carotid bruits LYMPHATICS: No lymphadenopathy CARDIAC: RRR, no murmurs, no rubs, no gallops RESPIRATORY:  Clear to auscultation without rales, wheezing or rhonchi  ABDOMEN: Soft, non-tender, non-distended MUSCULOSKELETAL:  No edema; No deformity  SKIN: Warm and dry NEUROLOGIC:  Alert and oriented x 3 PSYCHIATRIC:  Normal affect   ASSESSMENT:    1. Chest pain, unspecified type   2. Atypical chest pain   3. Prediabetes   4. Overweight   5. Dyslipidemia   6. Hypertriglyceridemia    PLAN:    In order of problems listed above:  Chest pain which is atypical but he does have significant risk factor for coronary artery disease on top of that stress did show some ST segment abnormality not enough to make a diagnosis but enough to be concerned I think the best option would be to pursue coronary CT angio.  Will schedule him for the test. Abnormal EKG echocardiogram to be done make sure structurally heart is normal. Prediabetes we spent a great of time talking about what to do with the situation I recommended especially after coronary CT angio will be acceptable to be very aggressive with exercises weight loss will be extremely beneficial. Dyslipidemia we will wait for results of his stress test before pursuing management of this problem.   Medication Adjustments/Labs and Tests Ordered: Current medicines are reviewed at length with the patient today.  Concerns regarding medicines are outlined above.   Orders Placed This Encounter  Procedures   EKG 12-Lead   No orders of the defined types were placed in this encounter.   Signed, Manfred Seed, MD, [facility]. 12/31/2023 3:09 PM    [facility]

## 2023-12-31 NOTE — Patient Instructions (Addendum)
 Medication Instructions:   TAKE: Metoprolol 100mg  1 tablet 2 hours prior to CT scan   Lab Work: None Ordered If you have labs (blood work) drawn today and your tests are completely normal, you will receive your results only by: MyChart Message (if you have MyChart) OR A paper copy in the mail If you have any lab test that is abnormal or we need to change your treatment, we will call you to review the results.   Testing/Procedures:  Your physician has requested that you have an echocardiogram. Echocardiography is a painless test that uses sound waves to create images of your heart. It provides your doctor with information about the size and shape of your heart and how well your heart's chambers and valves are working. This procedure takes approximately one hour. There are no restrictions for this procedure. Please do NOT wear cologne, perfume, aftershave, or lotions (deodorant is allowed). Please arrive 15 minutes prior to your appointment time.  Please note: We ask at that you not bring children with you during ultrasound (echo/ vascular) testing. Due to room size and safety concerns, children are not allowed in the ultrasound rooms during exams. Our front office staff cannot provide observation of children in our lobby area while testing is being conducted. An adult accompanying a patient to their appointment will only be allowed in the ultrasound room at the discretion of the ultrasound technician under special circumstances. We apologize for any inconvenience.   Your cardiac CT will be scheduled at one of the below locations:   [facility] [address] [place], [place] [address]  Please follow these instructions carefully (unless otherwise directed):  Hold all erectile dysfunction medications at least 3 days (72 hrs) prior to test.  On the Night Before the Test: Be sure to Drink plenty of water. Do not consume any caffeinated/decaffeinated beverages or chocolate 12  hours prior to your test. Do not take any antihistamines 12 hours prior to your test.  On the Day of the Test: Drink plenty of water until 1 hour prior to the test. Do not eat any food 4 hours prior to the test. You may take your regular medications prior to the test.  Take metoprolol (Lopressor) two hours prior to test.        After the Test: Drink plenty of water. After receiving IV contrast, you may experience a mild flushed feeling. This is normal. On occasion, you may experience a mild rash up to 24 hours after the test. This is not dangerous. If this occurs, you can take Benadryl 25 mg and increase your fluid intake. If you experience trouble breathing, this can be serious. If it is severe call 911 IMMEDIATELY. If it is mild, please call our office. If you take any of these medications: Glipizide/Metformin, Avandament, Glucavance, please do not take 48 hours after completing test unless otherwise instructed.  We will call to schedule your test 2-4 weeks out understanding that some insurance companies will need an authorization prior to the service being performed.   For non-scheduling related questions, please contact the cardiac imaging nurse navigator should you have any questions/concerns: Jinger Mount, Cardiac Imaging Nurse Navigator Chase Copping, Cardiac Imaging Nurse Navigator [facility] Direct Office Dial: [phone]   For scheduling needs, including cancellations and rescheduling, please call [place], [phone].    Follow-Up: At [facility], you and your health needs are our priority.  As part of our continuing mission to provide you with  exceptional heart care, we have created designated Provider Care Teams.  These Care Teams include your primary Cardiologist (physician) and Advanced Practice Providers (APPs -  Physician Assistants and Nurse Practitioners) who all work together to provide you with the care you need, when you need  it.  We recommend signing up for the patient portal called "MyChart".  Sign up information is provided on this After Visit Summary.  MyChart is used to connect with patients for Virtual Visits (Telemedicine).  Patients are able to view lab/test results, encounter notes, upcoming appointments, etc.  Non-urgent messages can be sent to your provider as well.   To learn more about what you can do with MyChart, go to [email or web address].    Your next appointment:   2 month(s)  The format for your next appointment:   In Person  Provider:   Ralene Burger, MD    Other Instructions NA

## 2024-01-27 ENCOUNTER — Ambulatory Visit (HOSPITAL_BASED_OUTPATIENT_CLINIC_OR_DEPARTMENT_OTHER)
Admission: RE | Admit: 2024-01-27 | Discharge: 2024-01-27 | Disposition: A | Discharge status: Home / Self Care | Source: Ambulatory Visit | Attending: Cardiology | Admitting: Cardiology

## 2024-01-27 ENCOUNTER — Ambulatory Visit

## 2024-01-27 DIAGNOSIS — R0609 Other forms of dyspnea: Secondary | ICD-10-CM | POA: Diagnosis present

## 2024-01-27 LAB — ECHOCARDIOGRAM COMPLETE
AR max vel: 2.64 cm2
AV Area VTI: 2.61 cm2
AV Area mean vel: 2.53 cm2
AV Mean grad: 3 mmHg
AV Peak grad: 5.5 mmHg
Ao pk vel: 1.17 m/s
Area-P 1/2: 3.77 cm2
Calc EF: 63.5 %
S' Lateral: 2.6 cm
Single Plane A2C EF: 59.9 %
Single Plane A4C EF: 66.6 %

## 2024-01-29 ENCOUNTER — Ambulatory Visit: Payer: Self-pay | Admitting: Cardiology

## 2024-01-30 ENCOUNTER — Encounter (HOSPITAL_COMMUNITY): Payer: Self-pay

## 2024-02-03 ENCOUNTER — Ambulatory Visit (HOSPITAL_BASED_OUTPATIENT_CLINIC_OR_DEPARTMENT_OTHER)
Admission: RE | Admit: 2024-02-03 | Discharge: 2024-02-03 | Disposition: A | Discharge status: Home / Self Care | Source: Ambulatory Visit | Attending: Cardiology | Admitting: Cardiology

## 2024-02-03 DIAGNOSIS — R072 Precordial pain: Secondary | ICD-10-CM | POA: Diagnosis present

## 2024-02-03 MED ORDER — METOPROLOL TARTRATE 5 MG/5ML IV SOLN: 10.0000 mg | Freq: Once | INTRAVENOUS | Status: DC | PRN

## 2024-02-03 MED ORDER — IOHEXOL 350 MG/ML SOLN
100.0000 mL | Freq: Once | INTRAVENOUS | Status: AC | PRN
  Administered 2024-02-03: 175 mL via INTRAVENOUS

## 2024-02-03 MED ORDER — NITROGLYCERIN 0.4 MG SL SUBL
0.8000 mg | SUBLINGUAL_TABLET | Freq: Once | SUBLINGUAL | Status: AC
  Administered 2024-02-03: 0.8 mg via SUBLINGUAL

## 2024-02-03 MED ORDER — NITROGLYCERIN 0.4 MG SL SUBL
0.8000 mg | SUBLINGUAL_TABLET | SUBLINGUAL | Status: DC | PRN
  Administered 2024-02-03: 0.8 mg via SUBLINGUAL

## 2024-02-03 MED ORDER — DILTIAZEM HCL 25 MG/5ML IV SOLN: 10.0000 mg | INTRAVENOUS | Status: DC | PRN

## 2024-03-02 ENCOUNTER — Other Ambulatory Visit: Payer: Self-pay

## 2024-03-04 ENCOUNTER — Ambulatory Visit: Attending: Cardiology | Admitting: Cardiology

## 2024-03-04 ENCOUNTER — Encounter: Payer: Self-pay | Admitting: Cardiology

## 2024-03-04 VITALS — BP 120/78 | HR 76 | Ht 68.0 in | Wt 202.0 lb

## 2024-03-04 DIAGNOSIS — E785 Hyperlipidemia, unspecified: Secondary | ICD-10-CM | POA: Diagnosis not present

## 2024-03-04 DIAGNOSIS — R7303 Prediabetes: Secondary | ICD-10-CM | POA: Diagnosis not present

## 2024-03-04 DIAGNOSIS — R0789 Other chest pain: Secondary | ICD-10-CM

## 2024-03-04 NOTE — Addendum Note (Signed)
 Addended by: ARLOA PLANAS D on: 03/04/2024 08:24 AM   Modules accepted: Orders

## 2024-03-04 NOTE — Patient Instructions (Addendum)
 Medication Instructions:  Your physician recommends that you continue on your current medications as directed. Please refer to the Current Medication list given to you today.  *If you need a refill on your cardiac medications before your next appointment, please call your pharmacy*   Lab Work: 3rd Floor [address]  [facility] [address] [place], [place] [address]  You need to have labs done when you are fasting.  You can come Monday through Friday 8:00 am to 11:30am and 1:00 to 4:00. You do not need to make an appointment as the order has already been placed.     Testing/Procedures: None Ordered   Follow-Up: At [facility], you and your health needs are our priority.  As part of our continuing mission to provide you with exceptional heart care, we have created designated Provider Care Teams.  These Care Teams include your primary Cardiologist (physician) and Advanced Practice Providers (APPs -  Physician Assistants and Nurse Practitioners) who all work together to provide you with the care you need, when you need it.  We recommend signing up for the patient portal called MyChart.  Sign up information is provided on this After Visit Summary.  MyChart is used to connect with patients for Virtual Visits (Telemedicine).  Patients are able to view lab/test results, encounter notes, upcoming appointments, etc.  Non-urgent messages can be sent to your provider as well.   To learn more about what you can do with MyChart, go to [email or web address].    Your next appointment:   12 month(s)  The format for your next appointment:   In Person  Provider:   Lamar Fitch, MD    Other Instructions NA

## 2024-03-04 NOTE — Progress Notes (Signed)
 Cardiology Office Note:    Date:  03/04/2024   ID:  Paul Mccann, DOB 05-15-1991, MRN [id number]  PCP:  Watt Harlene BROCKS, MD  Cardiologist:  Lamar Fitch, MD    Referring MD: Watt Harlene BROCKS, MD   No chief complaint on file. Follow-up  History of Present Illness:    Paul Mccann is a 33 y.o. male past medical history significant for borderline diabetes, overweight, hypertriglyceridemia, atypical chest pain.  Comes today to discuss results of his tests.  He did have echocardiogram done which was normal, coronary CT angio showed 25 to 49% stenosis of PDA but overall calcium score was 0, and plaque volume was very low.  Overall he is doing well since the time I spoke to him he stopped drinking sweet drinks he dropped few pounds as he started exercising on regular basis doing well denies have any chest pain tightness squeezing pressure burning chest  Past Medical History:  Diagnosis Date   Atypical chest pain 12/31/2023   De Quervain's tenosynovitis, left 12/28/2021   Dyslipidemia 12/31/2023   Fatty liver 10/01/2021   Noted on US  09/2021     Hypertriglyceridemia 12/31/2023   Overweight 09/12/2021   Prediabetes 09/12/2021   Viral upper respiratory tract infection 11/13/2021    Past Surgical History:  Procedure Laterality Date   NO PAST SURGERIES      Current Medications: No outpatient medications have been marked as taking for the 03/04/24 encounter (Office Visit) with Penni Penado J, MD.     Allergies:   Patient has no known allergies.   Social History   Socioeconomic History   Marital status: Married    Spouse name: Not on file   Number of children: Not on file   Years of education: Not on file   Highest education level: Not on file  Occupational History   Not on file  Tobacco Use   Smoking status: Never   Smokeless tobacco: Never  Vaping Use   Vaping status: Never Used  Substance and Sexual Activity   Alcohol use: Not Currently   Drug use: Never    Sexual activity: Not on file  Other Topics Concern   Not on file  Social History Narrative   Not on file   Social Drivers of Health   Financial Resource Strain: Not on file  Food Insecurity: Not on file  Transportation Needs: Not on file  Physical Activity: Not on file  Stress: Not on file  Social Connections: Not on file     Family History: The patient's Family history is unknown by patient. ROS:   Please see the history of present illness.    All 14 point review of systems negative except as described per history of present illness  EKGs/Labs/Other Studies Reviewed:         Recent Labs: 07/09/2023: ALT 49; BUN 16; Creatinine, Ser 0.99; Hemoglobin 15.2; Platelets 338.0; Potassium 4.3; Sodium 138  Recent Lipid Panel    Component Value Date/Time   CHOL 253 (H) 07/09/2023 0959   TRIG 366.0 (H) 07/09/2023 0959   HDL 38.90 (L) 07/09/2023 0959   CHOLHDL 7 07/09/2023 0959   VLDL 73.2 (H) 07/09/2023 0959   LDLCALC 141 (H) 07/09/2023 0959   LDLDIRECT 174.0 09/12/2021 1318    Physical Exam:    VS:  BP 120/78   Pulse 76   Ht 5' 8 (1.727 m)   Wt 202 lb (91.6 kg)   SpO2 97%   BMI 30.71 kg/m  Wt Readings from Last 3 Encounters:  03/04/24 202 lb (91.6 kg)  12/31/23 203 lb 3.2 oz (92.2 kg)  10/29/23 197 lb 12.8 oz (89.7 kg)     GEN:  Well nourished, well developed in no acute distress HEENT: Normal NECK: No JVD; No carotid bruits LYMPHATICS: No lymphadenopathy CARDIAC: RRR, no murmurs, no rubs, no gallops RESPIRATORY:  Clear to auscultation without rales, wheezing or rhonchi  ABDOMEN: Soft, non-tender, non-distended MUSCULOSKELETAL:  No edema; No deformity  SKIN: Warm and dry LOWER EXTREMITIES: no swelling NEUROLOGIC:  Alert and oriented x 3 PSYCHIATRIC:  Normal affect   ASSESSMENT:    1. Atypical chest pain   2. Dyslipidemia   3. Prediabetes    PLAN:    In order of problems listed above:  Atypical chest pain denies having any, coronary CT angio  showed narrowing of PDA and not sure exactly if this is atherosclerotic but with his risk factors which is hypertriglyceridemia, hypercholesterolemia, borderline diabetes I think will be reasonable to initiate statin.  He want us  to recheck his fasting lipid profile which we will do, and then we will decide about therapy.  In the meantime ask you to start taking antiplatelet therapy which will be baby aspirin every single day. Dyslipidemia again fasting lipid profile will be rechecked.  Last data I have is from November 2024 with LDL 141 HDL 38 triglycerides were 633. Prediabetes exercises weight loss and good diet and we discussed dietary ready.   Medication Adjustments/Labs and Tests Ordered: Current medicines are reviewed at length with the patient today.  Concerns regarding medicines are outlined above.  No orders of the defined types were placed in this encounter.  Medication changes: No orders of the defined types were placed in this encounter.   Signed, Lamar DOROTHA Fitch, MD, [facility] 03/04/2024 8:15 AM    [facility]

## 2024-03-11 ENCOUNTER — Encounter: Payer: Self-pay | Admitting: Family Medicine

## 2024-03-11 DIAGNOSIS — Z3009 Encounter for other general counseling and advice on contraception: Secondary | ICD-10-CM

## 2024-05-12 ENCOUNTER — Encounter: Payer: Self-pay | Admitting: Family Medicine

## 2024-05-12 DIAGNOSIS — N529 Male erectile dysfunction, unspecified: Secondary | ICD-10-CM

## 2024-05-12 MED ORDER — SILDENAFIL CITRATE 20 MG PO TABS: ORAL_TABLET | ORAL | 2 refills | Status: AC
